# Patient Record
Sex: Female | Born: 1960 | Race: White | Hispanic: No | Marital: Married | Smoking: Never smoker
Health system: Southern US, Community
[De-identification: ages and names within clinical notes are randomized; demographics above are authoritative.]

## PROBLEM LIST (undated history)

## (undated) DIAGNOSIS — K219 Gastro-esophageal reflux disease without esophagitis: Secondary | ICD-10-CM

## (undated) DIAGNOSIS — G4730 Sleep apnea, unspecified: Secondary | ICD-10-CM

## (undated) DIAGNOSIS — E119 Type 2 diabetes mellitus without complications: Secondary | ICD-10-CM

## (undated) DIAGNOSIS — L409 Psoriasis, unspecified: Secondary | ICD-10-CM

## (undated) DIAGNOSIS — J309 Allergic rhinitis, unspecified: Secondary | ICD-10-CM

## (undated) HISTORY — PX: ORIF ANKLE FRACTURE: SUR919

## (undated) HISTORY — DX: Psoriasis, unspecified: L40.9

## (undated) HISTORY — DX: Allergic rhinitis, unspecified: J30.9

## (undated) HISTORY — DX: Gastro-esophageal reflux disease without esophagitis: K21.9

## (undated) HISTORY — DX: Type 2 diabetes mellitus without complications: E11.9

## (undated) HISTORY — DX: Sleep apnea, unspecified: G47.30

---
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
## 1983-01-05 HISTORY — PX: SEPTOPLASTY: SUR1290

## 2007-01-05 HISTORY — PX: COLONOSCOPY: SHX174

## 2007-11-13 ENCOUNTER — Ambulatory Visit: Payer: Self-pay | Admitting: Gastroenterology

## 2009-05-15 ENCOUNTER — Emergency Department: Payer: Self-pay | Admitting: Emergency Medicine

## 2009-07-24 ENCOUNTER — Emergency Department: Payer: Self-pay | Admitting: Internal Medicine
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
## 2013-06-10 ENCOUNTER — Ambulatory Visit: Payer: Self-pay | Admitting: Emergency Medicine

## 2013-06-10 LAB — RAPID STREP-A WITH REFLX: Micro Text Report: NEGATIVE

## 2013-06-13 LAB — BETA STREP CULTURE(ARMC)

## 2015-10-29 ENCOUNTER — Other Ambulatory Visit: Payer: Self-pay | Admitting: Family Medicine

## 2015-10-29 DIAGNOSIS — Z1231 Encounter for screening mammogram for malignant neoplasm of breast: Secondary | ICD-10-CM

## 2015-12-22 ENCOUNTER — Encounter: Payer: Self-pay | Admitting: Radiology

## 2015-12-22 ENCOUNTER — Ambulatory Visit
Admission: RE | Admit: 2015-12-22 | Discharge: 2015-12-22 | Disposition: A | Discharge status: Home / Self Care | Payer: 59 | Source: Ambulatory Visit | Attending: Family Medicine | Admitting: Family Medicine

## 2015-12-22 DIAGNOSIS — Z1231 Encounter for screening mammogram for malignant neoplasm of breast: Secondary | ICD-10-CM | POA: Insufficient documentation

## 2015-12-31 ENCOUNTER — Inpatient Hospital Stay
Admission: RE | Admit: 2015-12-31 | Discharge: 2015-12-31 | Disposition: A | Discharge status: Home / Self Care | Payer: Self-pay | Source: Ambulatory Visit | Attending: *Deleted | Admitting: *Deleted

## 2015-12-31 ENCOUNTER — Other Ambulatory Visit: Payer: Self-pay | Admitting: *Deleted

## 2015-12-31 DIAGNOSIS — Z9289 Personal history of other medical treatment: Secondary | ICD-10-CM

## 2017-01-19 ENCOUNTER — Other Ambulatory Visit: Payer: Self-pay | Admitting: Certified Nurse Midwife

## 2017-02-24 ENCOUNTER — Encounter: Payer: Self-pay | Admitting: Certified Nurse Midwife

## 2017-02-24 ENCOUNTER — Ambulatory Visit (INDEPENDENT_AMBULATORY_CARE_PROVIDER_SITE_OTHER): Payer: Managed Care, Other (non HMO) | Admitting: Certified Nurse Midwife

## 2017-02-24 VITALS — BP 140/90 | HR 58 | Ht 69.0 in | Wt 252.0 lb

## 2017-02-24 DIAGNOSIS — Z124 Encounter for screening for malignant neoplasm of cervix: Secondary | ICD-10-CM | POA: Diagnosis not present

## 2017-02-24 DIAGNOSIS — Z1231 Encounter for screening mammogram for malignant neoplasm of breast: Secondary | ICD-10-CM | POA: Diagnosis not present

## 2017-02-24 DIAGNOSIS — Z01419 Encounter for gynecological examination (general) (routine) without abnormal findings: Secondary | ICD-10-CM

## 2017-02-24 DIAGNOSIS — Z1239 Encounter for other screening for malignant neoplasm of breast: Secondary | ICD-10-CM

## 2017-02-24 DIAGNOSIS — Z1211 Encounter for screening for malignant neoplasm of colon: Secondary | ICD-10-CM

## 2017-02-24 NOTE — Progress Notes (Signed)
Gynecology Annual Exam  PCP: Dortha KernBliss, Laura K, MD  Chief Complaint:  Chief Complaint  Patient presents with  . Gynecologic Exam    History of Present Illness:Hannah Cobb presents today for her annual exam. She is a 57 year old Caucasian/White female, G0 P0000, whose LMP was 06/02/2012. Her menses are absent and she is postmenopausal. She has had no spotting.  She does not have hot flashes and night sweats.  The patient's past medical history is remarkable for diabetes and obesity.  Since her last annual GYN exam dated 12/31/2015, she has been able to lose 11# on the ketodiet. Her last hemoglobin A1C was 6.5%. Her mother was diagnosed with nonalcoholic cirrhosis. She is sexually active. She does not have vaginal dryness.  Her most recent pap smear was obtained 12/31/2015 and was NIL with negative HPV. The previous Pap was NIL with positive HRHPV. Her most recent mammogram obtained on 12/22/2015 was normal. There is no family history of breast cancer. There is no family history of ovarian cancer. The patient does do occ self breast exams.  She had a colonoscopy in 2009 that showed diverticulosis. Her next colonoscopy is due this year.  A DEXA scan is not applicable for this patient.  The patient does not smoke.  The patient does not drink alcohol.  The patient does not use illegal drugs.  The patient does not exercise. Her BMI is 37.21 kg/m2 The patient does get adequate calcium in her diet.  She has her labs managed by her PCP.    The patient denies current symptoms of depression.    Review of Systems: Review of Systems  Constitutional: Positive for weight loss (intentional). Negative for chills and fever.  HENT: Negative for congestion, sinus pain and sore throat.   Eyes: Negative for blurred vision and pain.  Respiratory: Negative for hemoptysis, shortness of breath and wheezing.   Cardiovascular: Negative for chest pain, palpitations and leg swelling.  Gastrointestinal:  Negative for abdominal pain, blood in stool, diarrhea, heartburn, nausea and vomiting.  Genitourinary: Negative for dysuria, frequency, hematuria and urgency.  Musculoskeletal: Negative for back pain, joint pain and myalgias.  Skin: Negative for itching and rash.  Neurological: Negative for dizziness, tingling and headaches.  Endo/Heme/Allergies: Positive for environmental allergies. Negative for polydipsia. Does not bruise/bleed easily.       Negative for hirsutism   Psychiatric/Behavioral: Negative for depression. The patient is not nervous/anxious and does not have insomnia.     Past Medical History:  Past Medical History:  Diagnosis Date  . Allergic rhinitis   . Diabetes mellitus without complication (HCC)   . GERD (gastroesophageal reflux disease)   . Psoriasis     Past Surgical History:  Past Surgical History:  Procedure Laterality Date  . COLONOSCOPY  2009   diverticulosis  . ORIF ANKLE FRACTURE Left Dec    jan 1991-removal of hardware  . SEPTOPLASTY  1985   deviated septum  . TONSILLECTOMY AND ADENOIDECTOMY  1978    Family History:  Family History  Problem Relation Age of Onset  . Cirrhosis Mother        nonalcoholic  . Diabetes Mother   . Heart disease Mother   . Hypertension Mother   . Osteoporosis Mother   . Neuropathy Mother   . Hypertension Father   . Lymphoma Brother 44  . Diabetes Maternal Grandfather   . Diabetes Paternal Grandmother     Social History:  Social History   Socioeconomic History  .  Marital status: Married    Spouse name: Luisa Hart since 2004  . Number of children: Not on file  . Years of education: Not on file  . Highest education level: Not on file  Social Needs  . Financial resource strain: Not on file  . Food insecurity - worry: Not on file  . Food insecurity - inability: Not on file  . Transportation needs - medical: Not on file  . Transportation needs - non-medical: Not on file  Occupational History  . Occupation: Lab  [employer]  . Smoking status: Never Smoker  . Smokeless tobacco: Never Used  Substance and Sexual Activity  . Alcohol use: No    Frequency: Never  . Drug use: No  . Sexual activity: Yes    Partners: Male    Birth control/protection: Post-menopausal  Other Topics Concern  . Not on file  Social History Narrative  . Not on file    Allergies:  No Known Allergies  Medications: Current Outpatient Medications:  .  clorazepate (TRANXENE) 7.5 MG tablet, continuously as needed., Disp: , Rfl:  Physical Exam Vitals: BP 140/90   Pulse (!) 58   Ht 5\' 9"  (1.753 m)   Wt 252 lb (114.3 kg)   BMI 37.21 kg/m  General: WF in NAD HEENT: normocephalic, anicteric Neck: no thyroid enlargement, no palpable nodules, no cervical lymphadenopathy  Pulmonary: No increased work of breathing, CTAB Cardiovascular: RRR, without murmur  Breast: Breast symmetrical, no tenderness, no palpable nodules or masses, no skin or nipple retraction present, no nipple discharge.  No axillary, infraclavicular or supraclavicular lymphadenopathy. Abdomen: Soft, non-tender, non-distended.  Umbilicus without lesions.  No hepatomegaly or masses palpable. No evidence of hernia. Genitourinary:  External: Normal external female genitalia.  Normal urethral meatus, normal  Bartholin's and Skene's glands.    Vagina: Normal vaginal mucosa, no evidence of prolapse.    Cervix: Grossly normal in appearance, no bleeding, non-tender, nullip  Uterus: Anteverted, normal size, shape, and consistency, mobile, and non-tender  Adnexa: No adnexal masses, non-tender  Rectal: deferred  Lymphatic: no evidence of inguinal lymphadenopathy Extremities: no edema, erythema, or tenderness Neurologic: Grossly intact Psychiatric: mood appropriate, affect full     Assessment: 57 y.o. annual gyn exam  Plan:   1) Breast cancer screening - recommend monthly self breast exam and annual mammogram Mammogram was ordered today.  2) Cervical  cancer screening - Pap was done. ASCCP guidelines and rational discussed.  Patient opts for yearly screening interval  3) Colon cancer screening--colonoscopy due this year. Will refer to [facility] (Dr Ricki Rodriguez did last colonoscopy)  4) Discussed calcium and vitamin D3 requirements  5) RTO 1 yearr and prn  Farrel Conners, CNM

## 2017-02-25 ENCOUNTER — Encounter: Payer: Self-pay | Admitting: Certified Nurse Midwife

## 2017-02-25 DIAGNOSIS — E119 Type 2 diabetes mellitus without complications: Secondary | ICD-10-CM | POA: Insufficient documentation

## 2017-02-25 DIAGNOSIS — L409 Psoriasis, unspecified: Secondary | ICD-10-CM | POA: Insufficient documentation

## 2017-02-25 DIAGNOSIS — K219 Gastro-esophageal reflux disease without esophagitis: Secondary | ICD-10-CM | POA: Insufficient documentation

## 2017-02-25 DIAGNOSIS — J309 Allergic rhinitis, unspecified: Secondary | ICD-10-CM | POA: Insufficient documentation

## 2017-03-01 LAB — IGP,RFX APTIMA HPV ALL PTH: PAP Smear Comment: 0

## 2017-04-08 ENCOUNTER — Ambulatory Visit
Admission: RE | Admit: 2017-04-08 | Discharge: 2017-04-08 | Disposition: A | Discharge status: Home / Self Care | Payer: Managed Care, Other (non HMO) | Source: Ambulatory Visit | Attending: Certified Nurse Midwife | Admitting: Certified Nurse Midwife

## 2017-04-08 DIAGNOSIS — Z1231 Encounter for screening mammogram for malignant neoplasm of breast: Secondary | ICD-10-CM | POA: Insufficient documentation

## 2017-04-08 DIAGNOSIS — Z1239 Encounter for other screening for malignant neoplasm of breast: Secondary | ICD-10-CM

## 2017-07-04 LAB — HM COLONOSCOPY

## 2017-12-22 ENCOUNTER — Emergency Department
Admission: EM | Admit: 2017-12-22 | Discharge: 2017-12-22 | Disposition: A | Discharge status: Home / Self Care | Payer: Managed Care, Other (non HMO) | Attending: Emergency Medicine | Admitting: Emergency Medicine

## 2017-12-22 ENCOUNTER — Other Ambulatory Visit: Payer: Self-pay

## 2017-12-22 ENCOUNTER — Emergency Department: Payer: Managed Care, Other (non HMO)

## 2017-12-22 ENCOUNTER — Encounter: Payer: Self-pay | Admitting: Emergency Medicine

## 2017-12-22 DIAGNOSIS — E119 Type 2 diabetes mellitus without complications: Secondary | ICD-10-CM | POA: Diagnosis not present

## 2017-12-22 DIAGNOSIS — R0789 Other chest pain: Secondary | ICD-10-CM | POA: Diagnosis not present

## 2017-12-22 DIAGNOSIS — I10 Essential (primary) hypertension: Secondary | ICD-10-CM | POA: Diagnosis not present

## 2017-12-22 LAB — BASIC METABOLIC PANEL
Anion gap: 9 (ref 5–15)
BUN: 14 mg/dL (ref 6–20)
CO2: 27 mmol/L (ref 22–32)
Calcium: 9.2 mg/dL (ref 8.9–10.3)
Chloride: 102 mmol/L (ref 98–111)
Creatinine, Ser: 0.66 mg/dL (ref 0.44–1.00)
GFR calc Af Amer: >60 mL/min (ref >60)
GFR calc non Af Amer: >60 mL/min (ref >60)
Glucose, Bld: 148 mg/dL — ABNORMAL HIGH (ref 70–99)
Potassium: 4.2 mmol/L (ref 3.5–5.1)
Sodium: 138 mmol/L (ref 135–145)

## 2017-12-22 LAB — CBC WITH DIFFERENTIAL/PLATELET
ABS IMMATURE GRANULOCYTES: 0.02 10*3/uL (ref 0.00–0.07)
BASOS PCT: 1 %
Basophils Absolute: 0.1 10*3/uL (ref 0.0–0.1)
Eosinophils Absolute: 0.2 10*3/uL (ref 0.0–0.5)
Eosinophils Relative: 3 %
HCT: 40.4 % (ref 36.0–46.0)
Hemoglobin: 13.1 g/dL (ref 12.0–15.0)
IMMATURE GRANULOCYTES: 0 %
Lymphocytes Relative: 29 %
Lymphs Abs: 1.8 10*3/uL (ref 0.7–4.0)
MCH: 28.9 pg (ref 26.0–34.0)
MCHC: 32.4 g/dL (ref 30.0–36.0)
MCV: 89.2 fL (ref 80.0–100.0)
Monocytes Absolute: 0.3 10*3/uL (ref 0.1–1.0)
Monocytes Relative: 5 %
NEUTROS ABS: 3.8 10*3/uL (ref 1.7–7.7)
NEUTROS PCT: 62 %
NRBC: 0 % (ref 0.0–0.2)
PLATELETS: 248 10*3/uL (ref 150–400)
RBC: 4.53 MIL/uL (ref 3.87–5.11)
RDW: 12.5 % (ref 11.5–15.5)
WBC: 6.1 10*3/uL (ref 4.0–10.5)

## 2017-12-22 LAB — TROPONIN I: Troponin I: <0.03 ng/mL (ref <0.03)

## 2017-12-22 NOTE — ED Provider Notes (Addendum)
[facility] Emergency Department Provider Note  ____________________________________________   First MD Initiated Contact with Patient 12/22/17 0930     (approximate)  I have reviewed the triage vital signs and the nursing notes.   HISTORY  Chief Complaint Hypertension   HPI Hannah Cobb is a 57 y.o. female with a history of psoriasis, diabetes and GERD who was presented emergency department today with elevated blood pressure.  She states that she has been very stressed recently.  Says that she also began having left-sided thoracic pain today at work which is sharp and hurts worse with movement.  Denies any shortness of breath, central chest pain.  Denies any nausea or vomiting.  Says that she is a strong history of cardiac disease in her family.  Does not take any blood pressure medications at home but at work found her blood pressure to be 170s over 90s.  Has an anxiety medication that she has not taken today but says that she is feeling very anxious.   Past Medical History:  Diagnosis Date  . Allergic rhinitis   . Diabetes mellitus without complication (HCC)   . GERD (gastroesophageal reflux disease)   . Psoriasis     Patient Active Problem List   Diagnosis Date Noted  . Psoriasis   . GERD (gastroesophageal reflux disease)   . Diabetes mellitus without complication (HCC)   . Allergic rhinitis     Past Surgical History:  Procedure Laterality Date  . COLONOSCOPY  2009   diverticulosis  . ORIF ANKLE FRACTURE Left Dec    jan 1991-removal of hardware  . SEPTOPLASTY  1985   deviated septum  . TONSILLECTOMY AND ADENOIDECTOMY  1978    Prior to Admission medications   Medication Sig Start Date End Date Taking? Authorizing Provider  clorazepate (TRANXENE) 7.5 MG tablet continuously as needed. 11/23/13   [provider]    Allergies Patient has no known allergies.  Family History  Problem Relation Age of Onset  . Cirrhosis Mother         nonalcoholic  . Diabetes Mother   . Heart disease Mother   . Hypertension Mother   . Osteoporosis Mother   . Neuropathy Mother   . Hypertension Father   . Lymphoma Brother 44  . Diabetes Maternal Grandfather   . Diabetes Paternal Grandmother     Social History Social History   Tobacco Use  . Smoking status: Never Smoker  . Smokeless tobacco: Never Used  Substance Use Topics  . Alcohol use: No    Frequency: Never  . Drug use: No    Review of Systems  Constitutional: No fever/chills Eyes: No visual changes. ENT: No sore throat. Cardiovascular: As above Respiratory: Denies shortness of breath. Gastrointestinal: No abdominal pain.  No nausea, no vomiting.  No diarrhea.  No constipation. Genitourinary: Negative for dysuria. Musculoskeletal: Negative for back pain. Skin: Negative for rash. Neurological: Negative for headaches, focal weakness or numbness.   ____________________________________________   PHYSICAL EXAM:  VITAL SIGNS: ED Triage Vitals  Enc Vitals Group     BP 12/22/17 0934 (!) 170/98     Pulse Rate 12/22/17 0934 (!) 57     Resp 12/22/17 0934 18     Temp 12/22/17 0934 97.7 F (36.5 C)     Temp Source 12/22/17 0934 Oral     SpO2 12/22/17 0934 99 %     Weight 12/22/17 0925 246 lb (111.6 kg)     Height 12/22/17 0925 5\' 8"  (  1.727 m)     Head Circumference --      Peak Flow --      Pain Score 12/22/17 0925 3     Pain Loc --      Pain Edu? --      Excl. in GC? --     Constitutional: Alert and oriented. Well appearing and in no acute distress. Eyes: Conjunctivae are normal.  Head: Atraumatic. Nose: No congestion/rhinnorhea. Mouth/Throat: Mucous membranes are moist.  Neck: No stridor.   Cardiovascular: Normal rate, regular rhythm. Grossly normal heart sounds.  Good peripheral circulation with equal and bilateral radial pulses.  Chest pain reproducible over the left lateral thoracic wall but without any crepitus, deformity or ecchymosis  noted. Respiratory: Normal respiratory effort.  No retractions. Lungs CTAB. Gastrointestinal: Soft and nontender. No distention.  Musculoskeletal: No lower extremity tenderness nor edema.  No joint effusions. Neurologic:  Normal speech and language. No gross focal neurologic deficits are appreciated. Skin:  Skin is warm, dry and intact. No rash noted. Psychiatric: Mood and affect are normal. Speech and behavior are normal.  ____________________________________________   LABS (all labs ordered are listed, but only abnormal results are displayed)  Labs Reviewed  BASIC METABOLIC PANEL - Abnormal; Notable for the following components:      Result Value   Glucose, Bld 148 (*)    All other components within normal limits  CBC WITH DIFFERENTIAL/PLATELET  TROPONIN I   ____________________________________________  EKG  ED ECG REPORT I, Arelia Longestavid M Nelda Luckey, the attending physician, personally viewed and interpreted this ECG.   Date: 12/22/2017  EKG Time: 0950  Rate: 57  Rhythm: sinus bradycardia  Axis: Normal  Intervals: Nonspecific intraventricular conduction delay.  ST&T Change: No ST segment elevation or depression.  No abnormal T wave inversion.  ____________________________________________  RADIOLOGY  Chest x-ray without acute process. ____________________________________________   PROCEDURES  Procedure(s) performed:   Procedures  Critical Care performed:   ____________________________________________   INITIAL IMPRESSION / ASSESSMENT AND PLAN / ED COURSE  Pertinent labs & imaging results that were available during my care of the patient were reviewed by me and considered in my medical decision making (see chart for details).  Differential diagnosis includes, but is not limited to, ACS, aortic dissection, pulmonary embolism, cardiac tamponade, pneumothorax, pneumonia, pericarditis, myocarditis, GI-related causes including esophagitis/gastritis, and musculoskeletal  chest wall pain.   As part of my medical decision making, I reviewed the following data within the electronic MEDICAL RECORD NUMBER Notes from prior ED visits  ----------------------------------------- 10:53 AM on 12/22/2017 -----------------------------------------  Patient took a dose of her clorazepate.  Blood pressure now 147/71.  Reassuring lab work as well as EKG.  Patient will follow-up with her primary care doctor for repeat blood pressure visit.  I will not be starting patient on medication at this time as her symptoms appear to be improving with her anxiety medication and she reported anxiety upon the initial evaluation.  Furthermore, the patient denies any shortness of breath or central chest pain or any radiating chest pain to her back or her arms.  Likely musculoskeletal pain.  Patient understanding of the diagnosis well treatment and willing to comply.  ____________________________________________   FINAL CLINICAL IMPRESSION(S) / ED DIAGNOSES  Hypertension.  Chest wall pain.   NEW MEDICATIONS STARTED DURING THIS VISIT:  New Prescriptions   No medications on file     Note:  This document was prepared using Dragon voice recognition software and may include unintentional dictation errors.  Alan Riles, Myra Rude, MD 12/22/17 1054  I also discussed the "yeast infection" with the patient.  She says that she is not having any rash, discharge or burning with urination.  Was concerned because she has psoriasis about yeast in her that it can be diagnosed with a urine test.  We also clarified that she was talking about "Candida" and not "chlamydia."    Sevin Langenbach, Myra Rude, MD 12/22/17 1058

## 2017-12-22 NOTE — ED Triage Notes (Signed)
Pt arrived via POV with reports of left side pain when moving her arm and elevated blood pressure. Pt states while at work today her BP was 170/98, pt is not currently on BP meds. Pt was seen by EMS at job had normal EKG.   Pt also requesting urine test for chlamydia yeast due to eczema.

## 2017-12-22 NOTE — ED Notes (Signed)
NAD noted at time of D/C. Pt denies questions or concerns. Pt ambulatory to the lobby at this time.  

## 2017-12-22 NOTE — ED Notes (Signed)
Patient presents to the ED with pain to her left side.  Patient states area is somewhat tender on palpation.  Patient states area began hurting while at work and co-workers checked her blood pressure and it was high so they called EMS.  EMS recommended to patient that she may need blood work to rule out a cardiac event.  Patient is in no obvious distress at this time.  Denies chest pain.  Patient reports family history of heart problems.

## 2018-05-18 IMAGING — MG MM DIGITAL SCREENING BILAT W/ CAD
4 series · 4 of 4 positions shown · non-contrast
Comparison: Previous exam(s).

CLINICAL DATA: Screening.

EXAM:
DIGITAL SCREENING BILATERAL MAMMOGRAM WITH CAD

[R CC]
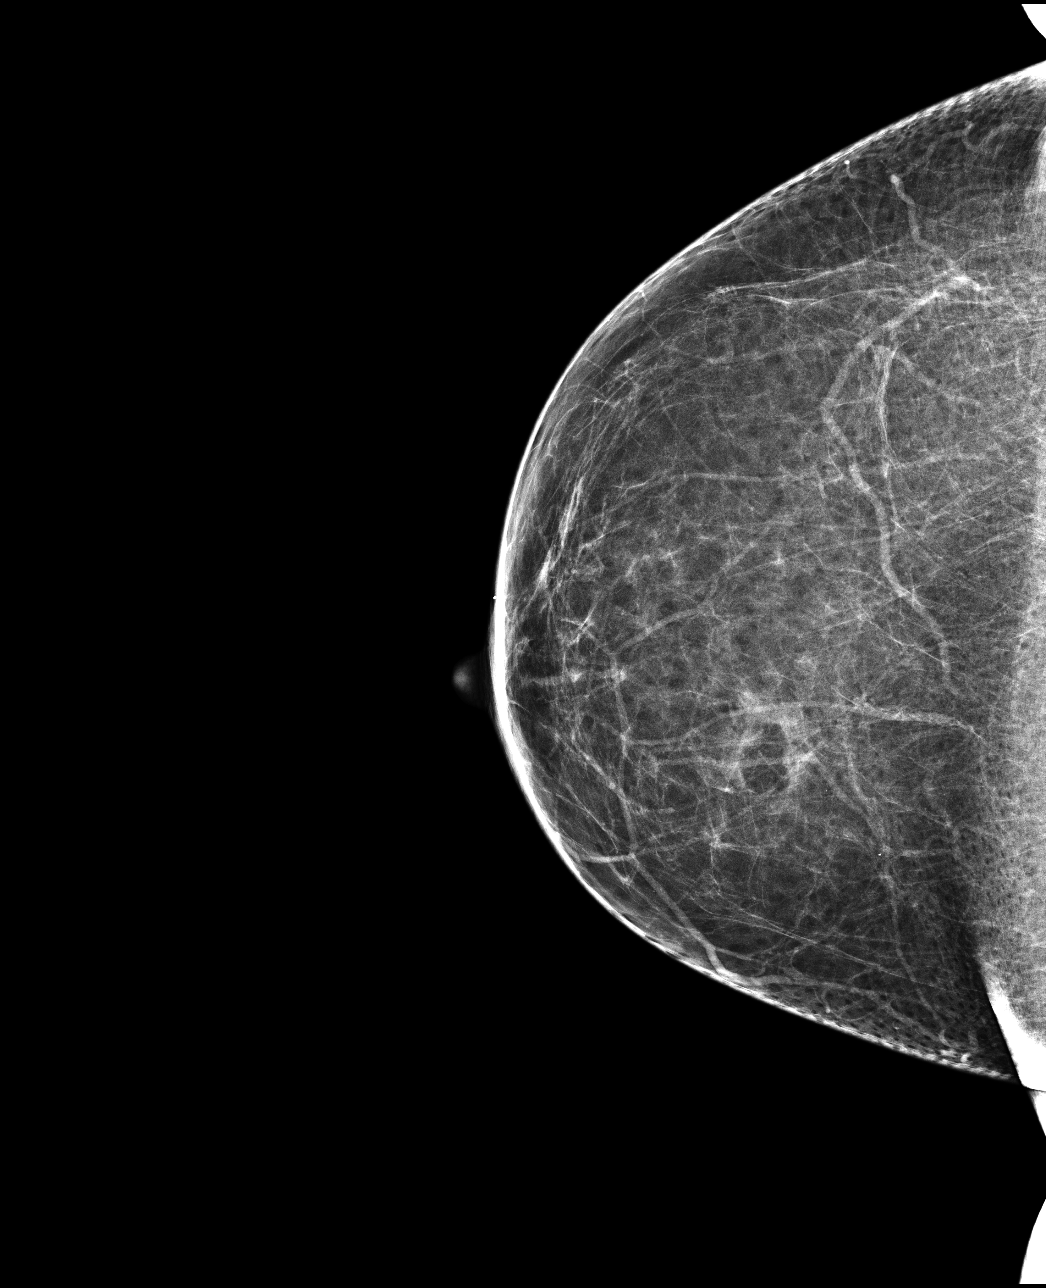

[L MLO]
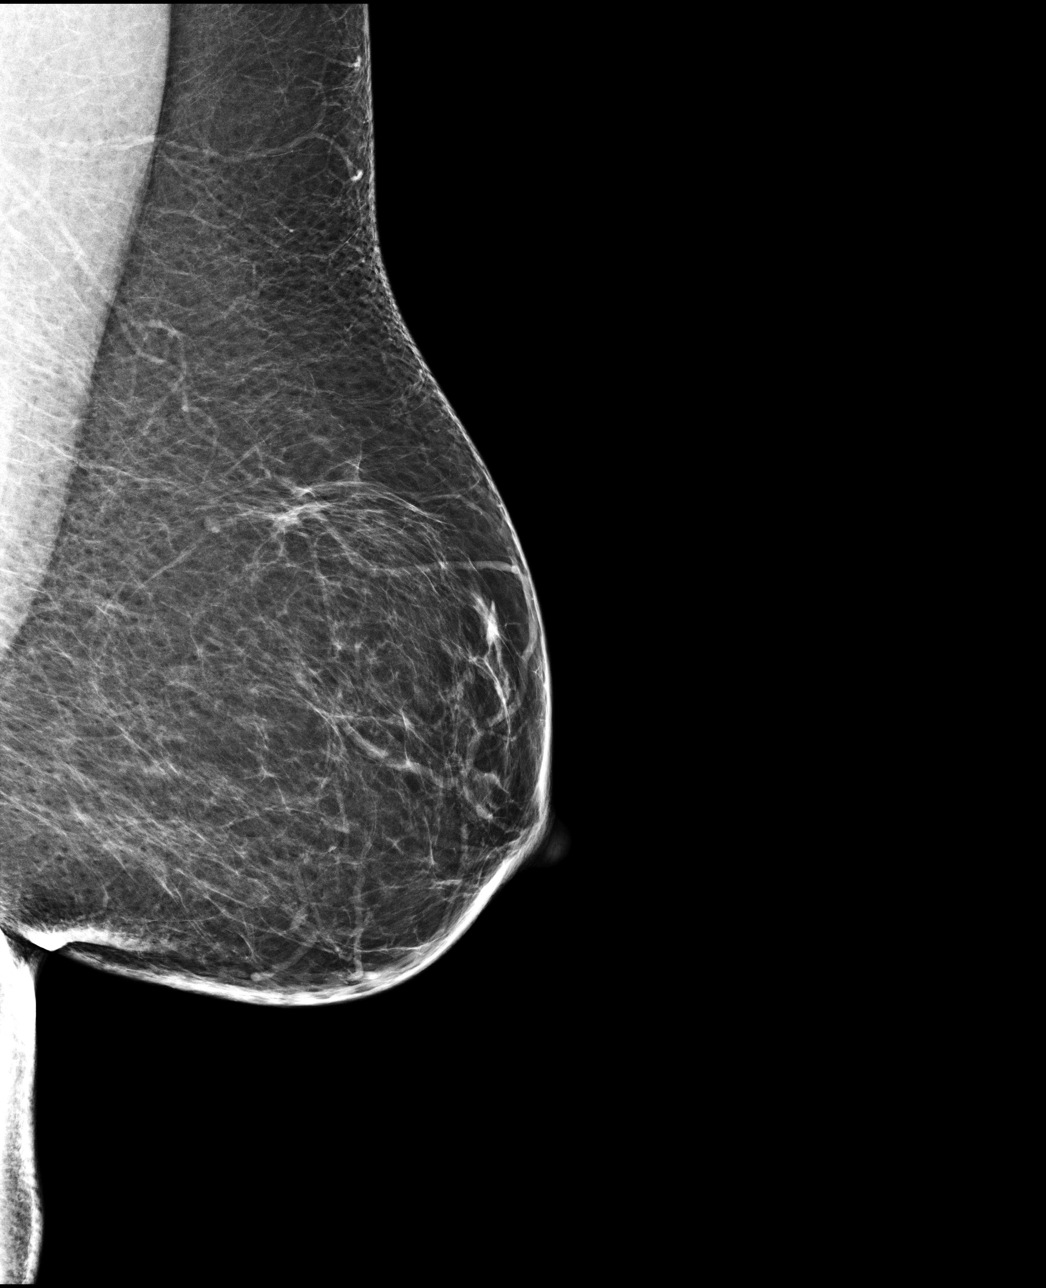

[L CC]
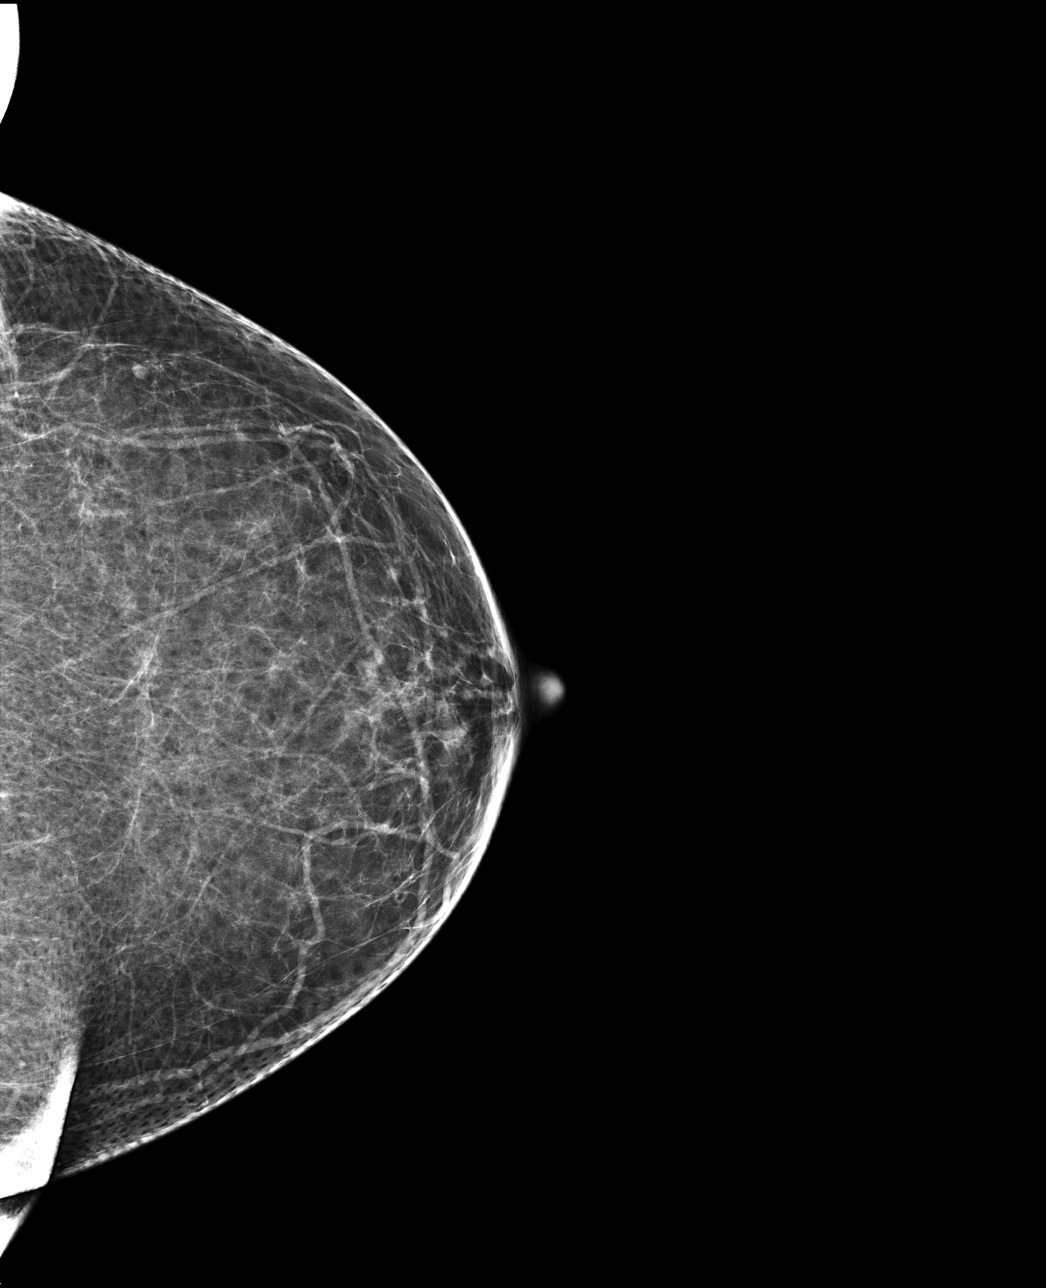

[R MLO]
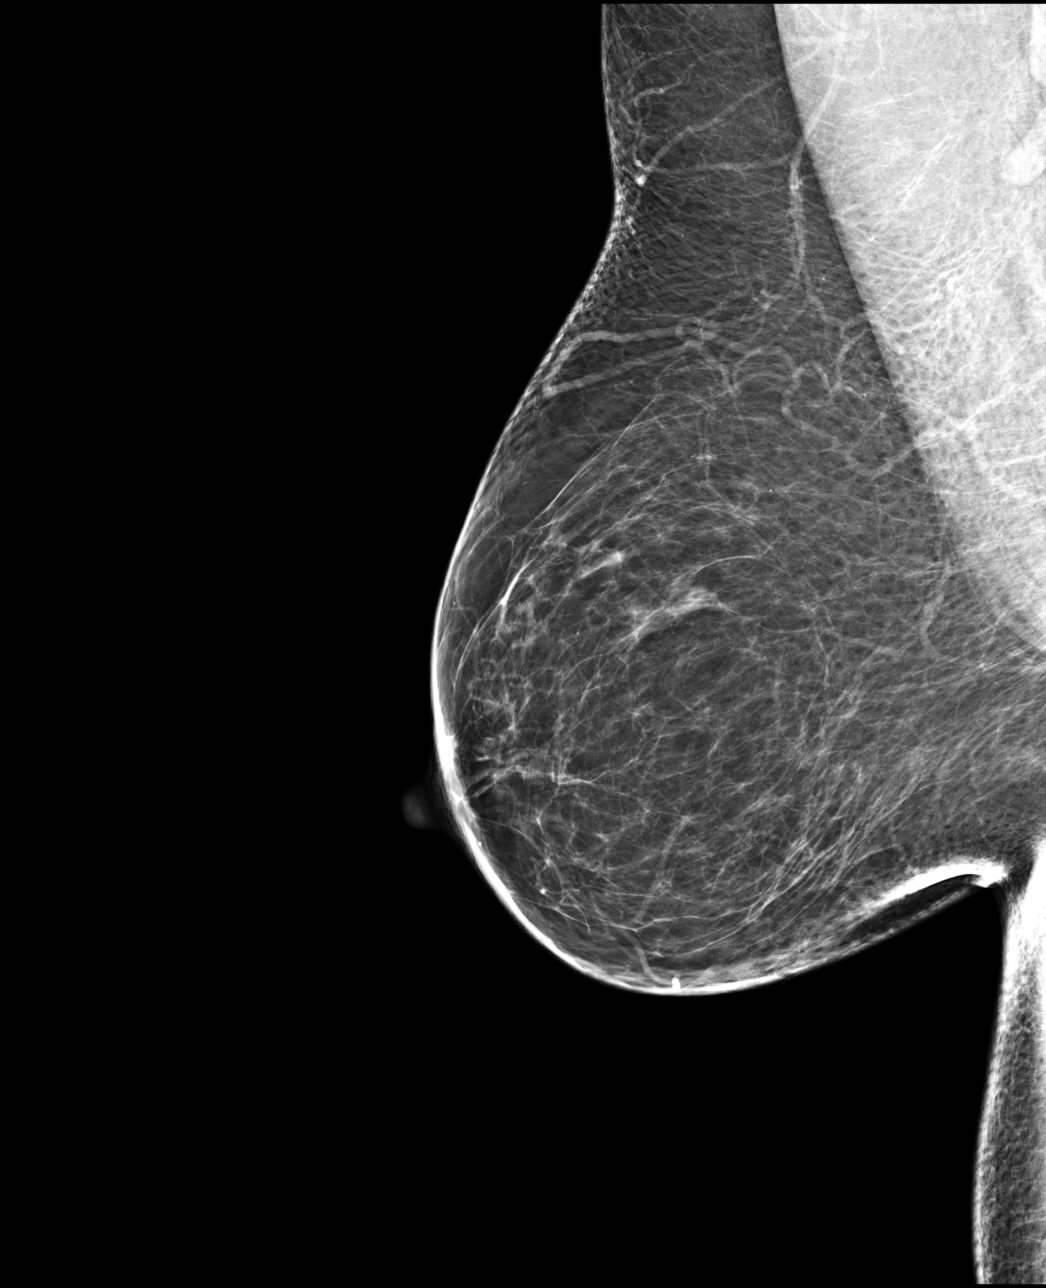

[4 of 4 positions shown; findings below may reference images not displayed]

ACR Breast Density Category b: There are scattered areas of
fibroglandular density.
FINDINGS: There are no findings suspicious for malignancy. Images were
processed with CAD.
IMPRESSION: No mammographic evidence of malignancy. A result letter of this
screening mammogram will be mailed directly to the patient.

RECOMMENDATION:
Screening mammogram in one year. (Code:AS-G-LCT)

BI-RADS CATEGORY  1: Negative.

## 2018-06-07 ENCOUNTER — Other Ambulatory Visit: Payer: Self-pay | Admitting: Certified Nurse Midwife

## 2018-06-07 DIAGNOSIS — Z1231 Encounter for screening mammogram for malignant neoplasm of breast: Secondary | ICD-10-CM

## 2018-06-16 ENCOUNTER — Encounter (INDEPENDENT_AMBULATORY_CARE_PROVIDER_SITE_OTHER): Payer: Self-pay

## 2018-06-16 ENCOUNTER — Other Ambulatory Visit: Payer: Self-pay

## 2018-06-16 ENCOUNTER — Ambulatory Visit
Admission: RE | Admit: 2018-06-16 | Discharge: 2018-06-16 | Disposition: A | Discharge status: Home / Self Care | Payer: Managed Care, Other (non HMO) | Source: Ambulatory Visit | Attending: Certified Nurse Midwife | Admitting: Certified Nurse Midwife

## 2018-06-16 DIAGNOSIS — Z1231 Encounter for screening mammogram for malignant neoplasm of breast: Secondary | ICD-10-CM | POA: Insufficient documentation

## 2019-06-22 ENCOUNTER — Ambulatory Visit (INDEPENDENT_AMBULATORY_CARE_PROVIDER_SITE_OTHER): Payer: Managed Care, Other (non HMO) | Admitting: Certified Nurse Midwife

## 2019-06-22 ENCOUNTER — Encounter: Payer: Self-pay | Admitting: Certified Nurse Midwife

## 2019-06-22 ENCOUNTER — Other Ambulatory Visit: Payer: Self-pay

## 2019-06-22 VITALS — BP 139/72 | HR 65 | Resp 18 | Ht 68.0 in | Wt 247.6 lb

## 2019-06-22 DIAGNOSIS — Z1231 Encounter for screening mammogram for malignant neoplasm of breast: Secondary | ICD-10-CM | POA: Diagnosis not present

## 2019-06-22 DIAGNOSIS — Z124 Encounter for screening for malignant neoplasm of cervix: Secondary | ICD-10-CM

## 2019-06-22 DIAGNOSIS — E669 Obesity, unspecified: Secondary | ICD-10-CM | POA: Insufficient documentation

## 2019-06-22 NOTE — Progress Notes (Signed)
Gynecology Annual Exam  PCP: Dortha Kern, MD  Chief Complaint:  Chief Complaint  Patient presents with  . Gynecologic Exam    History of Present Illness:Hannah Cobb presents today for her annual exam. She is a 59 year old Caucasian/White female, G0 P0000, whose LMP was 06/02/2012. Her menses are absent and she is postmenopausal. She has had no spotting.  She does not have hot flashes and night sweats.  The patient's past medical history is remarkable for diabetes and obesity.  Since her last annual GYN exam dated 02/24/2017, she gained weight and had gotten to 267#. She started the [place] diet plan and has lost 24# in 5 weeks. She is sexually active. She does not have vaginal dryness.  Her most recent pap smear was obtained 02/24/2017 and was NIL. Hx of Pap with positive HRHPV prior to 2017 Her most recent mammogram obtained on 06/16/2018 was normal. There is no family history of breast cancer. There is no family history of ovarian cancer. The patient does do self breast exams.  She had a colonoscopy in 11/18/2017 that was normal.  Her next colonoscopy is due in 2019 The patient has not had a DEXA scan  The patient does not smoke.  The patient does not drink alcohol.  The patient does not use illegal drugs.  The patient does not exercise. Her BMI is 37.66 kg/m2 The patient may get adequate calcium in her diet.  She has her labs managed by her PCP.    The patient denies current symptoms of depression.    Review of Systems: Review of Systems  Constitutional: Positive for weight loss (intentional). Negative for chills and fever.  HENT: Negative for congestion, sinus pain and sore throat.   Eyes: Negative for blurred vision and pain.  Respiratory: Negative for hemoptysis, shortness of breath and wheezing.   Cardiovascular: Negative for chest pain, palpitations and leg swelling.  Gastrointestinal: Positive for constipation (takes Colace prn). Negative for blood in stool,  diarrhea, heartburn, nausea and vomiting.  Genitourinary: Negative for dysuria, frequency, hematuria and urgency.  Musculoskeletal: Positive for joint pain (resolving right knee contusion). Negative for back pain and myalgias.  Skin: Positive for itching and rash (psoriasis).  Neurological: Negative for dizziness, tingling and headaches.  Endo/Heme/Allergies: Positive for environmental allergies. Negative for polydipsia. Does not bruise/bleed easily.       Negative for hirsutism   Psychiatric/Behavioral: Negative for depression. The patient is not nervous/anxious and does not have insomnia.     Past Medical History:  Past Medical History:  Diagnosis Date  . Allergic rhinitis   . Diabetes mellitus without complication (HCC)   . GERD (gastroesophageal reflux disease)   . Psoriasis     Past Surgical History:  Past Surgical History:  Procedure Laterality Date  . COLONOSCOPY  2009   diverticulosis  . ORIF ANKLE FRACTURE Left Dec    jan 1991-removal of hardware  . SEPTOPLASTY  1985   deviated septum  . TONSILLECTOMY AND ADENOIDECTOMY  1978    Family History:  Family History  Problem Relation Age of Onset  . Cirrhosis Mother        nonalcoholic  . Diabetes Mother   . Heart disease Mother   . Hypertension Mother   . Osteoporosis Mother   . Neuropathy Mother   . Hypertension Father   . Lymphoma Brother 44  . Diabetes Maternal Grandfather   . Hypertension Maternal Grandfather   . Heart disease Maternal Grandfather   .  Diabetes Paternal Grandmother   . Hypertension Maternal Grandmother   . Heart disease Maternal Grandmother     Social History:  Social History   Socioeconomic History  . Marital status: Married    Spouse name: Saralyn Pilar since 2004  . Number of children: Not on file  . Years of education: Not on file  . Highest education level: Not on file  Occupational History  . Occupation: Lab [employer]  . Smoking status: Never Smoker  . Smokeless tobacco:  Never Used  Vaping Use  . Vaping Use: Never used  Substance and Sexual Activity  . Alcohol use: No  . Drug use: No  . Sexual activity: Yes    Partners: Male    Birth control/protection: Post-menopausal  Other Topics Concern  . Not on file  Social History Narrative  . Not on file   Social Determinants of Health   Financial Resource Strain:   . Difficulty of Paying Living Expenses:   Food Insecurity:   . Worried About Charity fundraiser in the Last Year:   . Arboriculturist in the Last Year:   Transportation Needs:   . Film/video editor (Medical):   Marland Kitchen Lack of Transportation (Non-Medical):   Physical Activity:   . Days of Exercise per Week:   . Minutes of Exercise per Session:   Stress:   . Feeling of Stress :   Social Connections:   . Frequency of Communication with Friends and Family:   . Frequency of Social Gatherings with Friends and Family:   . Attends Religious Services:   . Active Member of Clubs or Organizations:   . Attends Archivist Meetings:   Marland Kitchen Marital Status:   Intimate Partner Violence:   . Fear of Current or Ex-Partner:   . Emotionally Abused:   Marland Kitchen Physically Abused:   . Sexually Abused:     Allergies:  No Known Allergies  Medications: Current Outpatient Medications:  .  clorazepate (TRANXENE) 7.5 MG tablet, continuously as needed., Disp: , Rfl:  .  naproxen (NAPROSYN) 500 MG tablet, Take 500 mg by mouth 2 (two) times daily., Disp: , Rfl:   Colace  And Tums prn Physical Exam Vitals: BP 139/72   Pulse 65   Resp 18   Ht 5\' 8"  (1.727 m)   Wt 247 lb 9.6 oz (112.3 kg)   SpO2 98%   BMI 37.65 kg/m  General: WF in NAD HEENT: normocephalic, anicteric Neck: no thyroid enlargement, no palpable nodules, no cervical lymphadenopathy  Pulmonary: No increased work of breathing, CTAB Cardiovascular: RRR, without murmur  Breast: Breast symmetrical, no tenderness, no palpable nodules or masses, no skin or nipple retraction present, no nipple  discharge.  No axillary, infraclavicular or supraclavicular lymphadenopathy. Abdomen: Soft, non-tender, non-distended.  Umbilicus without lesions.  No hepatomegaly or masses palpable. No evidence of hernia. Genitourinary:  External: Normal external female genitalia.  Normal urethral meatus, normal Bartholin's and Skene's glands.    Vagina: Normal vaginal mucosa, no evidence of prolapse.    Cervix: Grossly normal in appearance, no bleeding, non-tender, nullip  Uterus: Anteverted, normal size, shape, and consistency, mobile, and non-tender  Adnexa: No adnexal masses, non-tender  Rectal: deferred  Lymphatic: no evidence of inguinal lymphadenopathy Extremities: no edema, erythema, or tenderness Neurologic: Grossly intact Psychiatric: mood appropriate, affect full     Assessment: 59 y.o. annual gyn exam  Plan:   1) Breast cancer screening - recommend monthly self breast exam and annual mammogram  Mammogram was ordered today.  2) Cervical cancer screening - Pap was done. ASCCP guidelines and rational discussed.  Patient opts for yearly screening interval  3) Colon cancer screening--colonoscopy up to date. Next due 2029  4) Discussed calcium and vitamin D3 requirements. Encouraged to take vitamin D3 1000 IU daily  5) RTO 1 yearr and prn  Farrel Conners, CNM

## 2019-06-25 LAB — IGP, APTIMA HPV: HPV Aptima: NEGATIVE

## 2019-06-26 ENCOUNTER — Encounter: Payer: Self-pay | Admitting: Certified Nurse Midwife

## 2019-07-06 ENCOUNTER — Ambulatory Visit
Admission: RE | Admit: 2019-07-06 | Discharge: 2019-07-06 | Disposition: A | Discharge status: Home / Self Care | Payer: Managed Care, Other (non HMO) | Source: Ambulatory Visit | Attending: Certified Nurse Midwife | Admitting: Certified Nurse Midwife

## 2019-07-06 DIAGNOSIS — Z1231 Encounter for screening mammogram for malignant neoplasm of breast: Secondary | ICD-10-CM | POA: Insufficient documentation

## 2020-07-03 ENCOUNTER — Other Ambulatory Visit: Payer: Self-pay

## 2020-07-03 ENCOUNTER — Other Ambulatory Visit (HOSPITAL_COMMUNITY)
Admission: RE | Admit: 2020-07-03 | Discharge: 2020-07-03 | Disposition: A | Discharge status: Home / Self Care | Payer: Managed Care, Other (non HMO) | Source: Ambulatory Visit | Attending: Obstetrics | Admitting: Obstetrics

## 2020-07-03 ENCOUNTER — Encounter: Payer: Self-pay | Admitting: Obstetrics

## 2020-07-03 ENCOUNTER — Ambulatory Visit (INDEPENDENT_AMBULATORY_CARE_PROVIDER_SITE_OTHER): Payer: Managed Care, Other (non HMO) | Admitting: Obstetrics

## 2020-07-03 VITALS — BP 125/72 | Ht 68.0 in | Wt 233.6 lb

## 2020-07-03 DIAGNOSIS — Z1231 Encounter for screening mammogram for malignant neoplasm of breast: Secondary | ICD-10-CM

## 2020-07-03 DIAGNOSIS — Z124 Encounter for screening for malignant neoplasm of cervix: Secondary | ICD-10-CM | POA: Insufficient documentation

## 2020-07-03 DIAGNOSIS — Z01419 Encounter for gynecological examination (general) (routine) without abnormal findings: Secondary | ICD-10-CM

## 2020-07-03 NOTE — Progress Notes (Signed)
Gynecology Annual Exam  PCP: Hannah Kern, MD  Chief Complaint:  Chief Complaint  Patient presents with   Gynecologic Exam    History of Present Illness:Patient is a 60 y.o. G0P0000 presents for annual exam. The patient has no complaints today. She has a PCP and waqs seen by them recently for her annual. She has not had a GYN PE or pap in three years. Prefers annual pap smears. 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 Leaving for [place] in sevreal weeks. She is married; her husband is on disability, and she has no children.  LMP: No LMP recorded. Patient is postmenopausal. The patient is sexually active. She denies dyspareunia.  The patient does not perform self breast exams.  There is no notable family history of breast or ovarian cancer in her family.  The patient wears seatbelts: yes.   The patient has regular exercise: no.    The patient denies current symptoms of depression.     Review of Systems: Review of Systems  Constitutional: Negative.   Eyes: Negative.   Respiratory: Negative.    Cardiovascular: Negative.   Gastrointestinal: Negative.   Genitourinary: Negative.   Skin:        Psoriasis- many scars on her legs from scratching. Numerous varicosities on her upper legs, and anksle and feet - spider veins .  Neurological: Negative.   Endo/Heme/Allergies: Negative.   Psychiatric/Behavioral: Negative.     Past Medical History:  Patient Active Problem List   Diagnosis Date Noted   Obesity 06/22/2019   Psoriasis    GERD (gastroesophageal reflux disease)    Diabetes mellitus without complication (HCC)    Allergic rhinitis     Past Surgical History:  Past Surgical History:  Procedure Laterality Date   COLONOSCOPY  2009   diverticulosis   ORIF ANKLE FRACTURE Left Dec    jan 1991-removal of hardware   SEPTOPLASTY  1985   deviated septum   TONSILLECTOMY AND ADENOIDECTOMY  1978    Gynecologic History:  No LMP recorded.  Patient is postmenopausal. Last Pap: Results were: 2019 no abnormalities  Last mammogram: 2021 Results were: BI-RAD I  Obstetric History: G0P0000  Family History:  Family History  Problem Relation Age of Onset   Cirrhosis Mother        nonalcoholic   Diabetes Mother    Heart disease Mother    Hypertension Mother    Osteoporosis Mother    Neuropathy Mother    Hypertension Father    Lymphoma Brother 61   Diabetes Maternal Grandfather    Hypertension Maternal Grandfather    Heart disease Maternal Grandfather    Diabetes Paternal Grandmother    Hypertension Maternal Grandmother    Heart disease Maternal Grandmother     Social History:  Social History   Socioeconomic History   Marital status: Married    Spouse name: Hannah Cobb since 2004   Number of children: Not on file   Years of education: Not on file   Highest education level: Not on file  Occupational History   Occupation: Lab Tech  Tobacco Use   Smoking status: Never   Smokeless tobacco: Never  Vaping Use   Vaping Use: Never used  Substance and Sexual Activity   Alcohol use: No   Drug use: No   Sexual activity: Yes    Partners: Male    Birth control/protection: Post-menopausal  Other Topics Concern   Not on file  Social  History Narrative   Not on file   Social Determinants of Health   Financial Resource Strain: Not on file  Food Insecurity: Not on file  Transportation Needs: Not on file  Physical Activity: Not on file  Stress: Not on file  Social Connections: Not on file  Intimate Partner Violence: Not on file    Allergies:  No Known Allergies  Medications: Prior to Admission medications   Medication Sig Start Date End Date Taking? Authorizing Provider  clobetasol (TEMOVATE) 0.05 % external solution Apply topically. 05/09/20   [provider]  clorazepate (TRANXENE) 7.5 MG tablet continuously as needed. 11/23/13   [provider]  naproxen (NAPROSYN) 500 MG tablet Take 500 mg by  mouth 2 (two) times daily. Patient not taking: Reported on 07/03/2020 06/07/19   [provider]    Physical Exam Vitals: Blood pressure 125/72, height 5\' 8"  (1.727 m), weight 233 lb 9.6 oz (106 kg).  General: NAD HEENT: normocephalic, anicteric, obese Thyroid: no enlargement, no palpable nodules Pulmonary: No increased work of breathing, CTAB Cardiovascular: RRR, distal pulses 2+ Breast: Breast symmetrical,  B cup size with flattened nipples,no tenderness, no palpable nodules or masses, no skin or nipple retraction present, no nipple discharge.  No axillary or supraclavicular lymphadenopathy.'areas of fungal rash under both breasts Abdomen: NABS, soft, non-tender, non-distended.  Umbilicus without lesions.  No hepatomegaly, splenomegaly or masses palpable. No evidence of hernia  Genitourinary:  External: Normal external female genitalia.  Some menopausal changes evident- thinnin gof the labiaNormal urethral meatus, normal Bartholin's and Skene's glands.    Vagina: Normal vaginal mucosa, no evidence of prolapse.    Cervix: Grossly normal in appearance, no bleeding  Uterus: Non-enlarged, mobile, normal contour.  No CMT  Adnexa: ovaries non-enlarged, no adnexal masses  Rectal: deferred  Lymphatic: no evidence of inguinal lymphadenopathy Extremities: no edema, erythema, or tenderness Neurologic: Grossly intact Psychiatric: mood appropriate, affect full  Female chaperone present for pelvic and breast  portions of the physical exam     Assessment: 60 y.o. G0P0000 routine annual exam  Plan: Problem List Items Addressed This Visit   None Visit Diagnoses     Women's annual routine gynecological examination    -  Primary   Cervical cancer screening           1) Mammogram - recommend yearly screening mammogram.  Mammogram Was ordered today  2) STI screening  wasoffered and declined  3) ASCCP guidelines and rational discussed.  Patient opts for yearly screening  interval  4) Osteoporosis  - per USPTF routine screening DEXA at age 62   Consider FDA-approved medical therapies in postmenopausal women and men aged 73 years and older, based on the following: a) A hip or vertebral (clinical or morphometric) fracture b) T-score ? -2.5 at the femoral neck or spine after appropriate evaluation to exclude secondary causes C) Low bone mass (T-score between -1.0 and -2.5 at the femoral neck or spine) and a 10-year probability of a hip fracture ? 3% or a 10-year probability of a major osteoporosis-related fracture ? 20% based on the [place]-adapted WHO algorithm   5) Routine healthcare maintenance including cholesterol, diabetes screening discussed managed by PCP  6) Colonoscopy ordered by her PCP.  Screening recommended starting at age 72 for average risk individuals, age 18 for individuals deemed at increased risk (including African Americans) and recommended to continue until age 18.  For patient age 96-85 individualized approach is recommended.  Gold standard screening is via colonoscopy, Cologuard screening  is an acceptable alternative for patient unwilling or unable to undergo colonoscopy.  "Colorectal cancer screening for average?risk adults: 2018 guideline update from the American Cancer Society"CA: A Cancer Journal for Clinicians: Jun 02, 2016   7) Return in about 1 year (around 07/03/2021) for annual.   Mirna Mires, CNM  07/03/2020 10:15 AM   Domingo Pulse, [facility] 07/03/2020, 10:06 AM

## 2020-07-09 ENCOUNTER — Encounter: Payer: Self-pay | Admitting: Obstetrics

## 2020-07-09 LAB — CYTOLOGY - PAP
Comment: NEGATIVE
Diagnosis: NEGATIVE
High risk HPV: NEGATIVE

## 2020-07-11 ENCOUNTER — Other Ambulatory Visit: Payer: Self-pay

## 2020-07-11 ENCOUNTER — Ambulatory Visit (INDEPENDENT_AMBULATORY_CARE_PROVIDER_SITE_OTHER): Payer: Managed Care, Other (non HMO) | Admitting: Vascular Surgery

## 2020-07-11 VITALS — BP 137/81 | HR 64 | Resp 16 | Ht 66.0 in | Wt 226.0 lb

## 2020-07-11 DIAGNOSIS — E119 Type 2 diabetes mellitus without complications: Secondary | ICD-10-CM

## 2020-07-11 DIAGNOSIS — I83813 Varicose veins of bilateral lower extremities with pain: Secondary | ICD-10-CM | POA: Diagnosis not present

## 2020-07-11 DIAGNOSIS — L409 Psoriasis, unspecified: Secondary | ICD-10-CM | POA: Diagnosis not present

## 2020-07-11 NOTE — Assessment & Plan Note (Signed)
Has some rash and skin changes on her legs from psoriasis.  If she does get an open ulceration with her venous insufficiency, this is going to be much more difficult to heal.

## 2020-07-11 NOTE — Assessment & Plan Note (Signed)
blood glucose control important in reducing the progression of atherosclerotic disease. Also, involved in wound healing. On appropriate medications.  

## 2020-07-11 NOTE — Progress Notes (Signed)
Patient ID: Hannah Cobb, female   DOB: 12/03/1960, 60 y.o.   MRN: [id number]  Chief Complaint  Patient presents with   New Patient (Initial Visit)    Varicose veins of BLE    HPI Hannah Cobb is a 60 y.o. female.  I am asked to see the patient by L. Bliss for evaluation of painful varicose veins.  The patient presents with complaints of symptomatic varicosities of the lower extremities for many years. The patient reports a long standing history of varicosities and they have become painful over time. There was no clear inciting event or causative factor that started the symptoms.  The right leg is more severly affected. The patient elevates the legs for relief. The pain is described as heaviness and aching and she has lots of cramps at night in both legs. The symptoms are generally most severe in the evening, particularly when they have been on their feet for long periods of time.  She has been wearing compression stockings for 12 years.  Elevation and compression have been used to try to improve the symptoms with limited success. The patient complains of worsening swelling as an associated symptom. The patient has no previous history of deep venous thrombosis or superficial thrombophlebitis to their knowledge.     Past Medical History:  Diagnosis Date   Allergic rhinitis    Diabetes mellitus without complication (HCC)    GERD (gastroesophageal reflux disease)    Psoriasis     Past Surgical History:  Procedure Laterality Date   COLONOSCOPY  2009   diverticulosis   ORIF ANKLE FRACTURE Left Dec    jan 1991-removal of hardware   SEPTOPLASTY  1985   deviated septum   TONSILLECTOMY AND ADENOIDECTOMY  1978    Family History  Problem Relation Age of Onset   Cirrhosis Mother        nonalcoholic   Diabetes Mother    Heart disease Mother    Hypertension Mother    Osteoporosis Mother    Neuropathy Mother    Hypertension Father    Lymphoma Brother 33   Diabetes Maternal  Grandfather    Hypertension Maternal Grandfather    Heart disease Maternal Grandfather    Diabetes Paternal Grandmother    Hypertension Maternal Grandmother    Heart disease Maternal Grandmother       Social History   Tobacco Use   Smoking status: Never   Smokeless tobacco: Never  Vaping Use   Vaping Use: Never used  Substance Use Topics   Alcohol use: No   Drug use: No     No Known Allergies  Current Outpatient Medications  Medication Sig Dispense Refill   clobetasol (TEMOVATE) 0.05 % external solution Apply topically.     clorazepate (TRANXENE) 7.5 MG tablet continuously as needed.     naproxen (NAPROSYN) 500 MG tablet Take 500 mg by mouth 2 (two) times daily. (Patient not taking: Reported on 07/03/2020)     No current facility-administered medications for this visit.      REVIEW OF SYSTEMS (Negative unless checked)  Constitutional: [] Weight loss  [] Fever  [] Chills Cardiac: [] Chest pain   [] Chest pressure   [] Palpitations   [] Shortness of breath when laying flat   [] Shortness of breath at rest   [] Shortness of breath with exertion. Vascular:  [] Pain in legs with walking   [] Pain in legs at rest   [] Pain in legs when laying flat   [] Claudication   [] Pain in feet when walking  []   Pain in feet at rest  [] Pain in feet when laying flat   [] History of DVT   [] Phlebitis   [x] Swelling in legs   [x] Varicose veins   [] Non-healing ulcers Pulmonary:   [] Uses home oxygen   [] Productive cough   [] Hemoptysis   [] Wheeze  [] COPD   [] Asthma Neurologic:  [] Dizziness  [] Blackouts   [] Seizures   [] History of stroke   [] History of TIA  [] Aphasia   [] Temporary blindness   [] Dysphagia   [] Weakness or numbness in arms   [] Weakness or numbness in legs Musculoskeletal:  [] Arthritis   [] Joint swelling   [] Joint pain   [] Low back pain Hematologic:  [] Easy bruising  [] Easy bleeding   [] Hypercoagulable state   [] Anemic  [] Hepatitis Gastrointestinal:  [] Blood in stool   [] Vomiting blood   [] Gastroesophageal reflux/heartburn   [] Abdominal pain Genitourinary:  [] Chronic kidney disease   [] Difficult urination  [] Frequent urination  [] Burning with urination   [] Hematuria Skin:  [] Rashes   [] Ulcers   [] Wounds Psychological:  [] History of anxiety   []  History of major depression.    Physical Exam BP 137/81 (BP Location: Right Arm)   Pulse 64   Resp 16   Ht 5\' 6"  (1.676 m)   Wt 226 lb (102.5 kg)   BMI 36.48 kg/m  Gen:  WD/WN, NAD Head: [place]/AT, No temporalis wasting.  Ear/Nose/Throat: Hearing grossly intact Eyes: Sclera non-icteric. Conjunctiva clear Neck: Supple. Trachea midline Pulmonary:  Good air movement, no use of accessory muscles, respirations not labored.  Cardiac: RRR, No JVD Vascular: Varicosities extensive and measuring up to 3 mm in the right lower extremity        Varicosities extensive and measuring up to 2-3 mm in the left lower extremity Vessel Right Left  Radial Palpable Palpable                          PT Palpable Palpable  DP Palpable Palpable   Musculoskeletal: M/S 5/5 throughout.   Moderate stasis dermatitis changes are present both lower extremities.  1+ RLE edema.  1+ LLE edema Neurologic: Sensation grossly intact in extremities.  Symmetrical.  Speech is fluent.  Psychiatric: Judgment intact, Mood & affect appropriate for pt's clinical situation. Dermatologic: No rashes or ulcers noted.  No cellulitis or open wounds.    Radiology No results found.  Labs Recent Results (from the past 2160 hour(s))  Cytology - PAP     Status: None   Collection Time: 07/03/20 10:17 AM  Result Value Ref Range   High risk HPV Negative    Adequacy      Satisfactory for evaluation; transformation zone component PRESENT.   Diagnosis      - Negative for intraepithelial lesion or malignancy (NILM)   Comment Normal Reference Range HPV - Negative     Assessment/Plan:  Varicose veins of leg with pain, bilateral  The patient has symptoms consistent with  chronic venous insufficiency. We discussed the natural history and treatment options for venous disease. I recommended the regular use of 20 - 30 mm Hg compression stockings, and the patient has been wearing these for over a decade. I recommended leg elevation and anti-inflammatories as needed for pain. I have also recommended a complete venous duplex to assess the venous system for reflux or thrombotic issues. This can be done at the patient's convenience. I will see the patient back after the duplex to assess the response to conservative management, and determine further treatment options.  Psoriasis Has some rash and skin changes on her legs from psoriasis.  If she does get an open ulceration with her venous insufficiency, this is going to be much more difficult to heal.  Diabetes mellitus without complication (HCC) blood glucose control important in reducing the progression of atherosclerotic disease. Also, involved in wound healing. On appropriate medications.      Festus Barren 07/11/2020, 11:06 AM   This note was created with Dragon medical transcription system.  Any errors from dictation are unintentional.

## 2020-07-11 NOTE — Assessment & Plan Note (Addendum)
The patient has symptoms consistent with chronic venous insufficiency. We discussed the natural history and treatment options for venous disease. I recommended the regular use of 20 - 30 mm Hg compression stockings, and the patient has been wearing these for over a decade. I recommended leg elevation and anti-inflammatories as needed for pain. I have also recommended a complete venous duplex to assess the venous system for reflux or thrombotic issues. This can be done at the patient's convenience. I will see the patient back after the duplex to assess the response to conservative management, and determine further treatment options.

## 2020-07-14 ENCOUNTER — Other Ambulatory Visit: Payer: Self-pay

## 2020-07-14 ENCOUNTER — Ambulatory Visit (INDEPENDENT_AMBULATORY_CARE_PROVIDER_SITE_OTHER): Payer: Managed Care, Other (non HMO)

## 2020-07-14 ENCOUNTER — Ambulatory Visit (INDEPENDENT_AMBULATORY_CARE_PROVIDER_SITE_OTHER): Payer: Managed Care, Other (non HMO) | Admitting: Nurse Practitioner

## 2020-07-14 VITALS — BP 152/81 | HR 63 | Ht 68.0 in | Wt 230.0 lb

## 2020-07-14 DIAGNOSIS — I83813 Varicose veins of bilateral lower extremities with pain: Secondary | ICD-10-CM

## 2020-07-14 DIAGNOSIS — E119 Type 2 diabetes mellitus without complications: Secondary | ICD-10-CM

## 2020-07-17 ENCOUNTER — Other Ambulatory Visit: Payer: Self-pay | Admitting: Obstetrics

## 2020-07-17 ENCOUNTER — Telehealth: Payer: Self-pay

## 2020-07-17 ENCOUNTER — Encounter: Payer: Self-pay | Admitting: Obstetrics

## 2020-07-17 DIAGNOSIS — Z1231 Encounter for screening mammogram for malignant neoplasm of breast: Secondary | ICD-10-CM

## 2020-07-17 NOTE — Telephone Encounter (Signed)
Per Allie at front desk, patient was advised by [facility] they couldn't schedule her as they do not have an order. Reviewed chart. Her last visit note states mammogram ordered. However, under imaging, it shows the order was cancelled on 07/03/20. Please replace orders for mammogram.

## 2020-07-18 NOTE — Telephone Encounter (Signed)
Patient aware.

## 2020-07-19 ENCOUNTER — Encounter (INDEPENDENT_AMBULATORY_CARE_PROVIDER_SITE_OTHER): Payer: Self-pay | Admitting: Nurse Practitioner

## 2020-07-19 NOTE — Progress Notes (Signed)
Subjective:    Patient ID: Hannah Cobb, female    DOB: 1960/07/07, 60 y.o.   MRN: [id number] Chief Complaint  Patient presents with   Follow-up    Pt conv  U/S     Hannah Cobb is a 60 year old female that is seen for evaluation of symptomatic varicose veins. The patient relates burning and stinging which worsened steadily throughout the course of the day, particularly with standing. The patient also notes an aching and throbbing pain over the varicosities, particularly with prolonged dependent positions. The symptoms are significantly improved with elevation.  The patient also notes that during hot weather the symptoms are greatly intensified. The patient states the pain from the varicose veins interferes with work, daily exercise, shopping and household maintenance. At this point, the symptoms are persistent and severe enough that they're having a negative impact on lifestyle and are interfering with daily activities.  There is no history of DVT, PE or superficial thrombophlebitis. There is no history of ulceration or hemorrhage. The patient denies a significant family history of varicose veins.   The patient has worn graduated compression for the past decade on a regular basis.  At the present time the patient has been using over-the-counter analgesics. There is no history of prior surgical intervention or sclerotherapy.  Today noninvasive studies show no evidence of DVT or superficial thrombophlebitis seen bilaterally.  No evidence of deep venous insufficiency seen bilaterally.  No evidence of superficial venous reflux seen in the left lower extremity.  The right has evidence of reflux in the great saphenous vein at the saphenofemoral junction and proximal thigh.  There is also varicosities at the mid to distal thigh which also exhibit reflux.   Review of Systems  Cardiovascular:  Positive for leg swelling.  All other systems reviewed and are negative.     Objective:   Physical  Exam Vitals reviewed.  HENT:     Head: Normocephalic.  Cardiovascular:     Rate and Rhythm: Normal rate.     Pulses: Normal pulses.  Pulmonary:     Effort: Pulmonary effort is normal.  Skin:    Comments: Varicosity on the right lower extremity with other scattered varicosities  Neurological:     Mental Status: She is alert and oriented to person, place, and time. Mental status is at baseline.  Psychiatric:        Mood and Affect: Mood normal.        Behavior: Behavior normal.        Thought Content: Thought content normal.        Judgment: Judgment normal.    BP (!) 152/81   Pulse 63   Ht 5\' 8"  (1.727 m)   Wt 230 lb (104.3 kg)   BMI 34.97 kg/m   Past Medical History:  Diagnosis Date   Allergic rhinitis    Diabetes mellitus without complication (HCC)    GERD (gastroesophageal reflux disease)    Psoriasis     Social History   Socioeconomic History   Marital status: Married    Spouse name: since 2004   Number of children: Not on file   Years of education: Not on file   Highest education level: Not on file  Occupational History   Occupation: Lab Tech  Tobacco Use   Smoking status: Never   Smokeless tobacco: Never  Vaping Use   Vaping Use: Never used  Substance and Sexual Activity   Alcohol use: No   Drug use: No  Sexual activity: Yes    Partners: Male    Birth control/protection: Post-menopausal  Other Topics Concern   Not on file  Social History Narrative   Not on file   Social Determinants of Health   Financial Resource Strain: Not on file  Food Insecurity: Not on file  Transportation Needs: Not on file  Physical Activity: Not on file  Stress: Not on file  Social Connections: Not on file  Intimate Partner Violence: Not on file    Past Surgical History:  Procedure Laterality Date   COLONOSCOPY  2009   diverticulosis   ORIF ANKLE FRACTURE Left Dec    jan 1991-removal of hardware   SEPTOPLASTY  1985   deviated septum   TONSILLECTOMY  AND ADENOIDECTOMY  1978    Family History  Problem Relation Age of Onset   Cirrhosis Mother        nonalcoholic   Diabetes Mother    Heart disease Mother    Hypertension Mother    Osteoporosis Mother    Neuropathy Mother    Hypertension Father    Lymphoma Brother 67   Diabetes Maternal Grandfather    Hypertension Maternal Grandfather    Heart disease Maternal Grandfather    Diabetes Paternal Grandmother    Hypertension Maternal Grandmother    Heart disease Maternal Grandmother     No Known Allergies  CBC Latest Ref Rng & Units 12/22/2017  WBC 4.0 - 10.5 K/uL 6.1  Hemoglobin 12.0 - 15.0 g/dL 59.1  Hematocrit 63.8 - 46.0 % 40.4  Platelets 150 - 400 K/uL 248      CMP     Component Value Date/Time   NA 138 12/22/2017 1002   K 4.2 12/22/2017 1002   CL 102 12/22/2017 1002   CO2 27 12/22/2017 1002   GLUCOSE 148 (H) 12/22/2017 1002   BUN 14 12/22/2017 1002   CREATININE 0.66 12/22/2017 1002   CALCIUM 9.2 12/22/2017 1002   GFRNONAA >60 12/22/2017 1002   GFRAA >60 12/22/2017 1002     No results found.     Assessment & Plan:   1. Varicose veins of leg with pain, bilateral Recommend  I have reviewed my previous  discussion with the patient regarding  varicose veins and why they cause symptoms. Patient will continue  wearing graduated compression stockings class 1 on a daily basis, beginning first thing in the morning and removing them in the evening.    In addition, behavioral modification including elevation during the day was again discussed and this will continue.  The patient has utilized over the counter pain medications and has been exercising.  However, at this time conservative therapy has not alleviated the patient's symptoms of leg pain and swelling  Recommend: laser ablation of the right great saphenous veins to eliminate the symptoms of pain and swelling of the lower extremities caused by the severe superficial venous reflux disease.    2. Diabetes  mellitus without complication (HCC) Continue hypoglycemic medications as already ordered, these medications have been reviewed and there are no changes at this time.  Hgb A1C to be monitored as already arranged by primary service    Current Outpatient Medications on File Prior to Visit  Medication Sig Dispense Refill   clobetasol (TEMOVATE) 0.05 % external solution Apply topically.     clorazepate (TRANXENE) 7.5 MG tablet continuously as needed.     naproxen (NAPROSYN) 500 MG tablet Take 500 mg by mouth 2 (two) times daily.     No current facility-administered  medications on file prior to visit.    There are no Patient Instructions on file for this visit. No follow-ups on file.   Kris Hartmann, NP

## 2021-03-18 ENCOUNTER — Telehealth: Payer: Self-pay

## 2021-03-18 NOTE — Telephone Encounter (Signed)
Patient has had sleep study 5+ years ago however states the sleep study machine malfunctioned and would like to repeat. Patient does not remember the office who set up HST.   ?

## 2021-03-19 ENCOUNTER — Ambulatory Visit: Payer: Managed Care, Other (non HMO) | Admitting: Adult Health

## 2021-03-19 ENCOUNTER — Encounter: Payer: Self-pay | Admitting: Adult Health

## 2021-03-19 ENCOUNTER — Other Ambulatory Visit: Payer: Self-pay

## 2021-03-19 VITALS — BP 118/72 | HR 70 | Temp 97.8°F | Ht 68.0 in | Wt 262.0 lb

## 2021-03-19 DIAGNOSIS — R400 Somnolence: Secondary | ICD-10-CM | POA: Diagnosis not present

## 2021-03-19 DIAGNOSIS — G4719 Other hypersomnia: Secondary | ICD-10-CM | POA: Diagnosis not present

## 2021-03-19 NOTE — Assessment & Plan Note (Signed)
Healthy weight loss encouraged 

## 2021-03-19 NOTE — Assessment & Plan Note (Addendum)
Daytime sleepiness, snoring, restless sleep, BMI 39 all suspicious for underlying sleep apnea. ?Patient previously had a home sleep study that was not diagnostic.  Patient has significant symptom burden we will set up for a in lab split-night sleep study. ? ?- discussed how weight can impact sleep and risk for sleep disordered breathing ?- discussed options to assist with weight loss: combination of diet modification, cardiovascular and strength training exercises ?  ?- had an extensive discussion regarding the adverse health consequences related to untreated sleep disordered breathing ?- specifically discussed the risks for hypertension, coronary artery disease, cardiac dysrhythmias, cerebrovascular disease, and diabetes ?- lifestyle modification discussed ?  ?- discussed how sleep disruption can increase risk of accidents, particularly when driving ?- safe driving practices were discussed ?  ? ?Plan  ?Patient Instructions  ?Set up for split night sleep study  ?Work on healthy weight loss.  ?Do not drive if sleepy ?Follow up in 6 weeks for study results and treatment plan  ? ? ?  ? ?

## 2021-03-19 NOTE — Patient Instructions (Addendum)
Set up for split night sleep study  ?Work on healthy weight loss.  ?Do not drive if sleepy ?Follow up in 6 weeks for study results and treatment plan  ? ? ?

## 2021-03-19 NOTE — Progress Notes (Signed)
Reviewed and agree with assessment/plan. ? ? ?Mahogany Torrance, MD ?[facility] ?03/19/2021, 11:16 AM ?Pager:  [phone] ? ?

## 2021-03-19 NOTE — Progress Notes (Signed)
? ?@Patient  ID: , female    DOB: April 11, 1960, 61 y.o.   MRN: [id number] ? ?Chief Complaint  ?Patient presents with  ? Follow-up  ? ? ?Referring provider: ?[id number], MD ? ?HPI: ?61 year old female presents for sleep consult March 19, 2021 for daytime sleepiness, restless sleep, snoring ? ?TEST/EVENTS :  ? ?03/19/2021 Sleep consult  ?Patient presents for a sleep consult.  Patient complains of ongoing restless sleep, snoring, daytime sleepiness.  Typically goes to bed about 10 PM.  Takes about an hour to go to sleep.  Is up 3-5 times a night.  And gets up about 6 AM.  Patient says her weights been steady over the last couple years.  She says she has had a sleep study in the past. Was told she did not have OSA , but says the HST machine did not work well . She did not trust results. She snores terrible for greater than 59yrs. Goes to sleep very easily during daytime . Interrupts husband sleep. She is not on CPAP. ?Denies any symptoms suspicious for cataplexy or sleep paralysis. ?Caffeine intake 4 cups of coffee .  ?Epworth score is 4.  Mainly sleepiness with inactivity. ? ?Past medical history: Psoriasis, GERD , Borderline DM,. Obesity . Anxiety ,  ? ?Surgical history had sinus surgery 1985., left leg fracture s/p repair ,  tonsillectomy 1978  ? ?Social history patient is married.  She is retired- worked at [employer] .  She drinks alcohol socially.  She is a never smoker. Travels a lot. Loves retirement . No drug use  ?2 step kids .  ? ?Went to [place] last year, great trip.  ? ?No Known Allergies ? ?Immunization History  ?Administered Date(s) Administered  ? PFIZER(Purple Top)SARS-COV-2 Vaccination 02/08/2020, 02/29/2020  ? Td 12/04/2015  ? ? ?Past Medical History:  ?Diagnosis Date  ? Allergic rhinitis   ? Diabetes mellitus without complication (HCC)   ? GERD (gastroesophageal reflux disease)   ? Psoriasis   ? ? ?Tobacco History: ?Social History  ? ?Tobacco Use  ?Smoking Status Never  ?Smokeless Tobacco  Never  ? ?Counseling given: Not Answered ? ? ?Outpatient Medications Prior to Visit  ?Medication Sig Dispense Refill  ? clobetasol (TEMOVATE) 0.05 % external solution Apply topically.    ? clorazepate (TRANXENE) 7.5 MG tablet continuously as needed.    ? naproxen (NAPROSYN) 500 MG tablet Take 500 mg by mouth 2 (two) times daily.    ? ?No facility-administered medications prior to visit.  ? ? ? ?Review of Systems:  ? ?Constitutional:   No  weight loss, night sweats,  Fevers, chills,  ?+fatigue, or  lassitude. ? ?HEENT:   No headaches,  Difficulty swallowing,  Tooth/dental problems, or  Sore throat,  ?              No sneezing, itching, ear ache, nasal congestion, post nasal drip,  ? ?CV:  No chest pain,  Orthopnea, PND, swelling in lower extremities, anasarca, dizziness, palpitations, syncope.  ? ?GI  No heartburn, indigestion, abdominal pain, nausea, vomiting, diarrhea, change in bowel habits, loss of appetite, bloody stools.  ? ?Resp: No shortness of breath with exertion or at rest.  No excess mucus, no productive cough,  No non-productive cough,  No coughing up of blood.  No change in color of mucus.  No wheezing.  No chest wall deformity ? ?Skin: +psoriasis  ? ?GU: no dysuria, change in color of urine, no urgency or frequency.  No flank  pain, no hematuria  ? ?MS:  No joint pain or swelling.  No decreased range of motion.  No back pain. ? ? ? ?Physical Exam ? ?BP 118/72 (BP Location: Left Arm, Patient Position: Sitting, Cuff Size: Normal)   Pulse 70   Temp 97.8 ?F (36.6 ?C) (Oral)   Ht 5\' 8"  (1.727 m)   Wt 262 lb (118.8 kg)   SpO2 97%   BMI 39.84 kg/m?  ? ?GEN: A/Ox3; pleasant , NAD, well nourished  ?  ?HEENT:  [place]/AT,  NOSE-clear, THROAT-clear, no lesions, no postnasal drip or exudate noted. Class 3 MP airway  ? ?NECK:  Supple w/ fair ROM; no JVD; normal carotid impulses w/o bruits; no thyromegaly or nodules palpated; no lymphadenopathy.   ? ?RESP  Clear  P & A; w/o, wheezes/ rales/ or rhonchi. no accessory  muscle use, no dullness to percussion ? ?CARD:  RRR, no m/r/g, no peripheral edema, pulses intact, no cyanosis or clubbing. ? ?GI:   Soft & nt; nml bowel sounds; no organomegaly or masses detected.  ? ?Musco: Warm bil, no deformities or joint swelling noted.  ? ?Neuro: alert, no focal deficits noted.   ? ?Skin: Warm, no lesions or rashes ? ? ? ?Lab Results: ? ?CBC ? ? ?BMET ? ? ?BNP ?No results found for: BNP ? ?ProBNP ?No results found for: PROBNP ? ?Imaging: ?No results found. ? ? ? ?No flowsheet data found. ? ?No results found for: NITRICOXIDE ? ? ? ? ? ?Assessment & Plan:  ? ?Daytime sleepiness ?Daytime sleepiness, snoring, restless sleep, BMI 39 all suspicious for underlying sleep apnea. ?Patient previously had a home sleep study that was not diagnostic.  Patient has significant symptom burden we will set up for a in lab split-night sleep study. ? ?- discussed how weight can impact sleep and risk for sleep disordered breathing ?- discussed options to assist with weight loss: combination of diet modification, cardiovascular and strength training exercises ?  ?- had an extensive discussion regarding the adverse health consequences related to untreated sleep disordered breathing ?- specifically discussed the risks for hypertension, coronary artery disease, cardiac dysrhythmias, cerebrovascular disease, and diabetes ?- lifestyle modification discussed ?  ?- discussed how sleep disruption can increase risk of accidents, particularly when driving ?- safe driving practices were discussed ?  ? ?Plan  ?Patient Instructions  ?Set up for split night sleep study  ?Work on healthy weight loss.  ?Do not drive if sleepy ?Follow up in 6 weeks for study results and treatment plan  ? ? ?  ? ? ?Morbid obesity (HCC) ?Healthy weight loss encouraged  ? ? ?I spent   31 minutes dedicated to the care of this patient on the date of this encounter to include pre-visit review of records, face-to-face time with the patient discussing  conditions above, post visit ordering of testing, clinical documentation with the electronic health record, making appropriate referrals as documented, and communicating necessary findings to members of the patients care team.  ? ? ? , NP ?03/19/2021 ? ?

## 2021-05-05 ENCOUNTER — Telehealth: Payer: Self-pay | Admitting: Adult Health

## 2021-05-21 NOTE — Telephone Encounter (Signed)
Noted     Closing encounter

## 2021-06-25 ENCOUNTER — Telehealth: Payer: Self-pay | Admitting: Adult Health

## 2021-06-25 DIAGNOSIS — G4719 Other hypersomnia: Secondary | ICD-10-CM

## 2021-07-01 NOTE — Telephone Encounter (Signed)
Please advise if you are okay with [place] ordering HST.

## 2021-07-06 NOTE — Telephone Encounter (Signed)
Order for HST has been placed. Nothing further needed.

## 2021-07-06 NOTE — Telephone Encounter (Signed)
Tammy, please advise if okay to order HST since insurance denied split night?

## 2021-07-06 NOTE — Telephone Encounter (Signed)
Yes home sleep study is fine

## 2021-12-14 ENCOUNTER — Ambulatory Visit: Payer: Managed Care, Other (non HMO) | Admitting: Family Medicine

## 2021-12-14 ENCOUNTER — Encounter: Payer: Self-pay | Admitting: Family Medicine

## 2021-12-14 DIAGNOSIS — Z7689 Persons encountering health services in other specified circumstances: Secondary | ICD-10-CM | POA: Diagnosis not present

## 2021-12-14 DIAGNOSIS — K219 Gastro-esophageal reflux disease without esophagitis: Secondary | ICD-10-CM | POA: Diagnosis not present

## 2021-12-14 DIAGNOSIS — E1169 Type 2 diabetes mellitus with other specified complication: Secondary | ICD-10-CM | POA: Diagnosis not present

## 2021-12-14 DIAGNOSIS — F411 Generalized anxiety disorder: Secondary | ICD-10-CM

## 2021-12-14 LAB — POCT GLYCOSYLATED HEMOGLOBIN (HGB A1C): Hemoglobin A1C: 8.9 % — AB (ref 4.0–5.6)

## 2021-12-14 MED ORDER — CLORAZEPATE DIPOTASSIUM 7.5 MG PO TABS: 7.5000 mg | ORAL_TABLET | Freq: Two times a day (BID) | ORAL | 5 refills | Status: AC | PRN

## 2021-12-14 MED ORDER — ESOMEPRAZOLE MAGNESIUM 20 MG PO CPDR: 20.0000 mg | DELAYED_RELEASE_CAPSULE | Freq: Every day | ORAL | 3 refills | Status: DC

## 2021-12-14 NOTE — Patient Instructions (Addendum)
Thank you for coming to the office today.  Recent Labs    12/14/21 1104  HGBA1C 8.9*   Keep on Metformin 2 a day for now.  Refilled Clorazepate for as needed  Call insurance find cost and coverage of the following - check the following: - Drug Tier, Preferred List, On Formulary - All will require a "Prior Authorization" from [place] first, before you can find out the cost - Find out if there is "Step Therapy" (other medicines required before you can try these)  Once you pick the one you want to try, let me know - we can get a sample ready IF we have it in stock. Then try it - and before running out of medicine, contact me back to order your Rx so we have time to get it processed.  For Pre-Diabetes / Diabetes   1. Ozempic (Semaglutide injection) - start 0.76m weekly for 4 weeks then increase to 0.527mweekly, sample is 6 doses, re-use the same pen until empty, new needle each dose.   2. Trulicity (Dulaglutide) - once weekly 0.75 (likely we would start) and 1.5 max dose, sample is 2 doses, 1 dose per pen, each pen is a one time use, no visible needle, it is an auto-injector.  3. Mounjaro (Tirzepatide) - once weekly injection - 2.19m41meekly (sample starter kit has 4 pens in it for 4 weeks) we would increase the dose monthly if doing well up to 19mg21men eventually 7.5 or 10  Regardless of which one we choose, you can keep metformin for the first few weeks, and then as the dose on the shot increases, you would decrease metformin to 1 a day for a while then off if doing well.  4. Rybelsus (pill - oral semaglutide or ozempic) - once daily, taken first thing in morning without other meds    3 benefits - 1 significantly reduced A1c sugar, and may be able to reduce or stop metformin in future - 2 reduced appetite and weight loss with good results - 3 cardiovascular risk reduction, less likely to have heart attack/stroke    Please schedule a Follow-up Appointment to: Return in about 4 months  (around 04/02/2022) for Follow-up 3 months, on 04/02/22 (same day as her husband but diff time) - DM A1c.  If you have any other questions or concerns, please feel free to call the office or send a message through [facility] may also schedule an earlier appointment if necessary.  Additionally, you may be receiving a survey about your experience at our office within a few days to 1 week by e-mail or mail. We value your feedback.  AlexNobie Putnam [place]

## 2021-12-14 NOTE — Progress Notes (Signed)
Subjective:    Patient ID: Hannah Cobb, female    DOB: [birth year], 61 y.o.   MRN: [id number]  Hannah Cobb is a 61 y.o. female presenting on 12/14/2021 for Establish Care   HPI  Previously followed by Dr Quillian Quince  Morbid Obesity BMI >40 Exercise regimen, Walking 1 mile daily, if skip day will do 2 mile next day.  CHRONIC HTN: Reports elevated BP Current Meds - None   Denies CP, dyspnea, HA, edema, dizziness / lightheadedness  CHRONIC DM, Type 2: Reports concerns prior A1c 8 range, due today Meds: Metformin IR 500mg  x 2 daily, previously max 4 per day Reports good compliance. Tolerating well w/o side-effects Improving lifestyle Denies hypoglycemia, polyuria, visual changes, numbness or tingling.  Generalized anxiety Chronic problem, improved overall, uses med intermittently AS NEEDED with good results, takes few times per month max  GERD On OTC Nexium 20mg   Takes OTC Women's MVI  Scalp Psoriasis, severe   Health Maintenance: Upcoming health maintenance discussion at next visit      No data to display          Past Medical History:  Diagnosis Date   Allergic rhinitis    Diabetes mellitus without complication (HCC)    GERD (gastroesophageal reflux disease)    Psoriasis    Sleep apnea    Past Surgical History:  Procedure Laterality Date   COLONOSCOPY  2009   diverticulosis   ORIF ANKLE FRACTURE Left Dec    jan 1991-removal of hardware   SEPTOPLASTY  1985   deviated septum   TONSILLECTOMY AND ADENOIDECTOMY  1978   Social History   Socioeconomic History   Marital status: Married    Spouse name: 2010 since 2004   Number of children: Not on file   Years of education: Not on file   Highest education level: Not on file  Occupational History   Occupation: Lab Tech  Tobacco Use   Smoking status: Never   Smokeless tobacco: Never  Vaping Use   Vaping Use: Never used  Substance and Sexual Activity   Alcohol use: Yes    Alcohol/week: 1.0  standard drink of alcohol    Types: 1 Standard drinks or equivalent per week   Drug use: No   Sexual activity: Yes    Partners: Male    Birth control/protection: Post-menopausal  Other Topics Concern   Not on file  Social History Narrative   Not on file   Social Determinants of Health   Financial Resource Strain: Not on file  Food Insecurity: Not on file  Transportation Needs: Not on file  Physical Activity: Inactive (02/24/2017)   Exercise Vital Sign    Days of Exercise per Week: 0 days    Minutes of Exercise per Session: 0 min  Stress: No Stress Concern Present (02/24/2017)   02/26/2017 of Occupational Health - Occupational Stress Questionnaire    Feeling of Stress : Only a little  Social Connections: Socially Integrated (02/24/2017)   Social Connection and Isolation Panel [NHANES]    Frequency of Communication with Friends and Family: More than three times a week    Frequency of Social Gatherings with Friends and Family: More than three times a week    Attends Religious Services: More than 4 times per year    Active Member of [employer] or Organizations: Yes    Attends 02/26/2017: More than 4 times per year    Marital Status: Married  [employer] Violence: Not At Risk (  02/24/2017)   Humiliation, Afraid, Rape, and Kick questionnaire    Fear of Current or Ex-Partner: No    Emotionally Abused: No    Physically Abused: No    Sexually Abused: No   Family History  Problem Relation Age of Onset   Cirrhosis Mother        nonalcoholic   Diabetes Mother    Heart disease Mother    Hypertension Mother    Osteoporosis Mother    Neuropathy Mother    Hypertension Father    Lymphoma Brother 41   Diabetes Maternal Grandfather    Hypertension Maternal Grandfather    Heart disease Maternal Grandfather    Diabetes Paternal Grandmother    Hypertension Maternal Grandmother    Heart disease Maternal Grandmother    Current Outpatient Medications on File Prior  to Visit  Medication Sig   ascorbic acid (VITAMIN C) 500 MG tablet Take 1,000 mg by mouth daily.   Cholecalciferol (VITAMIN D3) 10 MCG (400 UNIT) tablet Take 400 Units by mouth daily.   clobetasol (TEMOVATE) 0.05 % external solution Apply topically.   diphenhydrAMINE (BENADRYL) 50 MG capsule Take 50 mg by mouth at bedtime as needed.   gabapentin (NEURONTIN) 300 MG capsule Take 300 mg by mouth daily.   Multiple Vitamins-Minerals (CENTRUM WOMEN PO) Take by mouth.   naproxen (NAPROSYN) 500 MG tablet Take 500 mg by mouth 2 (two) times daily.   TREMFYA 100 MG/ML SOPN Inject 100 mg as directed.   No current facility-administered medications on file prior to visit.    Review of Systems Per HPI unless specifically indicated above     Objective:    BP (!) 146/88 (BP Location: Left Wrist, Cuff Size: Normal)   Pulse 70   Ht 5\' 8"  (1.727 m)   Wt 268 lb 3.2 oz (121.7 kg)   SpO2 96%   BMI 40.78 kg/m   Wt Readings from Last 3 Encounters:  12/14/21 268 lb 3.2 oz (121.7 kg)  03/19/21 262 lb (118.8 kg)  07/14/20 230 lb (104.3 kg)    Physical Exam Vitals and nursing note reviewed.  Constitutional:      General: She is not in acute distress.    Appearance: Normal appearance. She is well-developed. She is obese. She is not diaphoretic.     Comments: Well-appearing, comfortable, cooperative  HENT:     Head: Normocephalic and atraumatic.  Eyes:     General:        Right eye: No discharge.        Left eye: No discharge.     Conjunctiva/sclera: Conjunctivae normal.  Cardiovascular:     Rate and Rhythm: Normal rate.  Pulmonary:     Effort: Pulmonary effort is normal.  Skin:    General: Skin is warm and dry.     Findings: No erythema or rash.  Neurological:     Mental Status: She is alert and oriented to person, place, and time.  Psychiatric:        Mood and Affect: Mood normal.        Behavior: Behavior normal.        Thought Content: Thought content normal.     Comments: Well  groomed, good eye contact, normal speech and thoughts    Results for orders placed or performed in visit on 12/14/21  POCT glycosylated hemoglobin (Hb A1C)  Result Value Ref Range   Hemoglobin A1C 8.9 (A) 4.0 - 5.6 %      Assessment & Plan:  Problem List Items Addressed This Visit     Diabetes mellitus (HCC)   Relevant Orders   POCT glycosylated hemoglobin (Hb A1C) (Completed)   GERD (gastroesophageal reflux disease)   Relevant Medications   esomeprazole (NEXIUM) 20 MG capsule   Morbid obesity (HCC) - Primary   Other Visit Diagnoses     Encounter to establish care with new doctor       Generalized anxiety disorder       Relevant Medications   clorazepate (TRANXENE) 7.5 MG tablet       Establish care  Type 2 DIABETES Discussion today on managing her DIABETES A1c today POC was 8.9, prior 8.5 range Only on Metformin, discussion on next agents, she will check cost coverage on GLP1/GIP therapy and notify us with preferred med option and she can pick up sample of either Ozempic, Trulicity or Mounjaro and we can initiate therapy. - Will order rx after sample trial and continue Metformin until dose adjust over course of first 4-6 weeks depending on sugars.  GERD Continue Nexium 20mg  daily generic rx  GAD Anxiety Re order intermittent AS NEEDED Clorazepate 7.5mg -15mg  AS NEEDED rarely taking   Meds ordered this encounter  Medications   esomeprazole (NEXIUM) 20 MG capsule    Sig: Take 1 capsule (20 mg total) by mouth daily before breakfast.    Dispense:  90 capsule    Refill:  3   clorazepate (TRANXENE) 7.5 MG tablet    Sig: Take 1-2 tablets (7.5-15 mg total) by mouth 2 (two) times daily as needed for anxiety.    Dispense:  30 tablet    Refill:  5      Follow up plan: Return in about 4 months (around 04/02/2022) for Follow-up 3 months, on 04/02/22 (same day as her husband but diff time) - DM A1c.  04/04/2022, DO [facility] 12/14/2021, 10:36 AM

## 2021-12-22 ENCOUNTER — Telehealth: Payer: Self-pay

## 2021-12-22 NOTE — Telephone Encounter (Signed)
Thanks. Yes 1 box sample mounjaro 2.5mg  weekly inj, 4 pens in the box.   Saralyn Pilar, DO [facility] 12/22/2021, 1:25 PM

## 2021-12-22 NOTE — Telephone Encounter (Signed)
Sample box given to the patient.

## 2021-12-22 NOTE — Telephone Encounter (Signed)
The pt is in the office to pick up a sample of Mounjaro. She spoke with her insurance and they informed her that the Greggory Keen will be covered under her plan. Please advise

## 2022-01-08 ENCOUNTER — Telehealth: Payer: Self-pay | Admitting: Family Medicine

## 2022-01-08 DIAGNOSIS — E1169 Type 2 diabetes mellitus with other specified complication: Secondary | ICD-10-CM

## 2022-01-08 MED ORDER — MOUNJARO 5 MG/0.5ML ~~LOC~~ SOAJ: 5.0000 mg | SUBCUTANEOUS | 2 refills | Status: DC

## 2022-01-08 NOTE — Telephone Encounter (Signed)
Pt reports that she was given samples of the [facility] and she was told if it works then a Rx could be sent to her pharmacy. Pt requests that the Rx for [facility] be sent to  [facility] [employer] [address] Phone: [phone]  Fax: [phone]

## 2022-01-08 NOTE — Telephone Encounter (Signed)
New rx Mounjaro 5mg  weekly sent to her pharmacy [facility]. May need PA. Thank you  Nobie Putnam, DO [facility] 01/08/2022, 5:36 PM

## 2022-01-15 ENCOUNTER — Ambulatory Visit: Payer: Managed Care, Other (non HMO) | Admitting: Family Medicine

## 2022-01-15 ENCOUNTER — Encounter: Payer: Self-pay | Admitting: Family Medicine

## 2022-01-15 VITALS — BP 132/88 | HR 67 | Ht 68.0 in | Wt 257.0 lb

## 2022-01-15 DIAGNOSIS — E1169 Type 2 diabetes mellitus with other specified complication: Secondary | ICD-10-CM

## 2022-01-15 DIAGNOSIS — G43909 Migraine, unspecified, not intractable, without status migrainosus: Secondary | ICD-10-CM

## 2022-01-15 NOTE — Patient Instructions (Addendum)
Thank you for coming to the office today.  Mounjaro 5mg  current dose, use weekly for 4 weeks Pick up 1 more box from pharmacy of Mounjaro 5mg  for another 4 weeks. When complete, then switch to the Trulicity 1.5mg  SAMPLE until those run out, then start the Trulicity 1.5mg  weekly - prescription  When you receive your card, please come by the office, submit the card to [place], and also schedule a 3 month apt for Diabetes A1c check.  Also, please request at that time, a 90 day order Trulicity 1.5mg  to the mail order pharmacy.  Migraine  Medications to help stop a migraine  Nurtec ODT dissolving tablet, take one every 48 hours.  Zavzpret nasal one dose spray, every 24 hour.  Roselyn Meier - pill take 1 may repeat 1 in 2 hours if need, or can repeat next day 24 hours.   Please schedule a Follow-up Appointment to: Return for Future follow-up Diabetes A1c (3 months AFTER you bring in the new card).  If you have any other questions or concerns, please feel free to call the office or send a message through [facility]. You may also schedule an earlier appointment if necessary.  Additionally, you may be receiving a survey about your experience at our office within a few days to 1 week by e-mail or mail. We value your feedback.  Nobie Putnam, DO [place]

## 2022-01-15 NOTE — Progress Notes (Signed)
Subjective:    Patient ID: Hannah Cobb, female    DOB: [birth year], 62 y.o.   MRN: [id number]  Hannah Cobb is a 62 y.o. female presenting on 01/15/2022 for Diabetes, Medication Management, and Headache   HPI  CHRONIC DM, Type 2: Last A1c 8.9 12/2021 Improved on Mounjaro, issue with lack of insurance now on COBRA through 2/29 then new insurance. Will no longer cover Mounjaro Meds: Mounjaro 5mg  weekly inj Reports good compliance. Tolerating well w/o side-effects Improving lifestyle Denies hypoglycemia, polyuria, visual changes, numbness or tingling.  Migraines, episodic Has tried Excedrin migraine AS NEEDED limited relief Not on other meds 02/03/22 apt Neurology apt [facility].       No data to display          Social History   Tobacco Use   Smoking status: Never   Smokeless tobacco: Never  Vaping Use   Vaping Use: Never used  Substance Use Topics   Alcohol use: Yes    Alcohol/week: 1.0 standard drink of alcohol    Types: 1 Standard drinks or equivalent per week   Drug use: No    Review of Systems Per HPI unless specifically indicated above     Objective:    BP 132/88   Pulse 67   Ht 5\' 8"  (1.727 m)   Wt 257 lb (116.6 kg)   SpO2 98%   BMI 39.08 kg/m   Wt Readings from Last 3 Encounters:  01/15/22 257 lb (116.6 kg)  12/14/21 268 lb 3.2 oz (121.7 kg)  03/19/21 262 lb (118.8 kg)    Physical Exam Vitals and nursing note reviewed.  Constitutional:      General: She is not in acute distress.    Appearance: Normal appearance. She is well-developed. She is obese. She is not diaphoretic.     Comments: Well-appearing, comfortable, cooperative  HENT:     Head: Normocephalic and atraumatic.  Eyes:     General:        Right eye: No discharge.        Left eye: No discharge.     Conjunctiva/sclera: Conjunctivae normal.  Cardiovascular:     Rate and Rhythm: Normal rate.  Pulmonary:     Effort: Pulmonary effort is normal.  Skin:    General:  Skin is warm and dry.     Findings: No erythema or rash.  Neurological:     Mental Status: She is alert and oriented to person, place, and time.  Psychiatric:        Mood and Affect: Mood normal.        Behavior: Behavior normal.        Thought Content: Thought content normal.     Comments: Well groomed, good eye contact, normal speech and thoughts    Results for orders placed or performed in visit on 12/14/21  POCT glycosylated hemoglobin (Hb A1C)  Result Value Ref Range   Hemoglobin A1C 8.9 (A) 4.0 - 5.6 %      Assessment & Plan:   Problem List Items Addressed This Visit     Diabetes mellitus ([place]) - Primary   Episodic migraine    Type 2 Diabetes A1c 8.9, not at goal Improved on therapy Barrier w/ change loss of insurance > COBRA > new insurance  Has Mounjaro 5mg  current dose, use weekly for 4 weeks + 1 refill before COBRA ends  Sample Trulicity 1.5mg  weekly inj x 4 pen (1 month) today, start AFTER Mounjaro 5  When you  receive your new ins card, please come by the office, submit the card to [place], and also schedule a 3 month apt for Diabetes A1c check.  Also, please request at that time, a 90 day order Trulicity 1.5mg  to the mail order pharmacy.  Migraines, episodic  Samples, for abortive today for trial - Nurtec ODT x 4 pills, Ubrelvy 100mg  x 1 pill  F/u with Neurology as scheduled.  No orders of the defined types were placed in this encounter.     Follow up plan: Return for Future follow-up Diabetes A1c (3 months AFTER you bring in the new card).   Nobie Putnam, [facility] 01/15/2022, 4:32 PM

## 2022-02-01 NOTE — Progress Notes (Unsigned)
PCP: Olin Hauser, DO   No chief complaint on file.   HPI:      Ms. Hannah CARBERRY is a 62 y.o. G0P0000 whose LMP was No LMP recorded. Patient is postmenopausal., presents today for her annual examination.  Her menses are absent due to menopause. She {does:18564} have vasomotor sx.   Sex activity: {sex active: 315163}. She {does:18564} have vaginal dryness.  Last Pap: 07/03/20  Results were: no abnormalities /neg HPV DNA.  Hx of STDs: {STD hx:14358}  Last mammogram: 07/06/19  Results were: normal--routine follow-up in 12 months There is no FH of breast cancer. There is no FH of ovarian cancer. The patient {does:18564} do self-breast exams.  Colonoscopy: 2002  Repeat due after 10*** years.   Tobacco use: {tob:20664} Alcohol use: {Alcohol:11675} No drug use Exercise: {exercise:31265}  She {does:18564} get adequate calcium and Vitamin D in her diet.  Labs with PCP.   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   Obesity 06/22/2019   Psoriasis    GERD (gastroesophageal reflux disease)    Diabetes mellitus ([place])    Allergic rhinitis     Past Surgical History:  Procedure Laterality Date   COLONOSCOPY  2009   diverticulosis   ORIF ANKLE FRACTURE Left Dec    jan 1991-removal of hardware   SEPTOPLASTY  1985   deviated septum   TONSILLECTOMY AND ADENOIDECTOMY  1978    Family History  Problem Relation Age of Onset   Cirrhosis Mother        nonalcoholic   Diabetes Mother    Heart disease Mother    Hypertension Mother    Osteoporosis Mother    Neuropathy Mother    Hypertension Father    Lymphoma Brother 42   Diabetes Maternal Grandfather    Hypertension Maternal Grandfather    Heart disease Maternal Grandfather    Diabetes Paternal Grandmother    Hypertension Maternal Grandmother    Heart  disease Maternal Grandmother     Social History   Socioeconomic History   Marital status: Married    Spouse name: Saralyn Pilar since 2004   Number of children: Not on file   Years of education: Not on file   Highest education level: Not on file  Occupational History   Occupation: Lab Tech  Tobacco Use   Smoking status: Never   Smokeless tobacco: Never  Vaping Use   Vaping Use: Never used  Substance and Sexual Activity   Alcohol use: Yes    Alcohol/week: 1.0 standard drink of alcohol    Types: 1 Standard drinks or equivalent per week   Drug use: No   Sexual activity: Yes    Partners: Male    Birth control/protection: Post-menopausal  Other Topics Concern   Not on file  Social History Narrative   Not on file   Social Determinants of Health   Financial Resource Strain: Not on file  Food Insecurity: Not on file  Transportation Needs: Not on file  Physical Activity: Inactive (02/24/2017)   Exercise Vital Sign    Days of Exercise per Week: 0 days    Minutes of Exercise per Session: 0 min  Stress: No Stress Concern Present (02/24/2017)   [place]    Feeling of Stress : Only a little  Social Connections: Socially  Integrated (02/24/2017)   Social Connection and Isolation Panel [NHANES]    Frequency of Communication with Friends and Family: More than three times a week    Frequency of Social Gatherings with Friends and Family: More than three times a week    Attends Religious Services: More than 4 times per year    Active Member of [employer] or Organizations: Yes    Attends Music therapist: More than 4 times per year    Marital Status: Married  Human resources officer Violence: Not At Risk (02/24/2017)   Humiliation, Afraid, Rape, and Kick questionnaire    Fear of Current or Ex-Partner: No    Emotionally Abused: No    Physically Abused: No    Sexually Abused: No     Current Outpatient Medications:     ascorbic acid (VITAMIN C) 500 MG tablet, Take 1,000 mg by mouth daily., Disp: , Rfl:    Cholecalciferol (VITAMIN D3) 10 MCG (400 UNIT) tablet, Take 400 Units by mouth daily., Disp: , Rfl:    clobetasol (TEMOVATE) 0.05 % external solution, Apply topically., Disp: , Rfl:    clorazepate (TRANXENE) 7.5 MG tablet, Take 1-2 tablets (7.5-15 mg total) by mouth 2 (two) times daily as needed for anxiety., Disp: 30 tablet, Rfl: 5   diphenhydrAMINE (BENADRYL) 50 MG capsule, Take 50 mg by mouth at bedtime as needed., Disp: , Rfl:    esomeprazole (NEXIUM) 20 MG capsule, Take 1 capsule (20 mg total) by mouth daily before breakfast., Disp: 90 capsule, Rfl: 3   gabapentin (NEURONTIN) 300 MG capsule, Take 300 mg by mouth daily., Disp: , Rfl:    MOUNJARO 5 MG/0.5ML Pen, Inject 5 mg into the skin once a week., Disp: 2 mL, Rfl: 2   Multiple Vitamins-Minerals (CENTRUM WOMEN PO), Take by mouth., Disp: , Rfl:    naproxen (NAPROSYN) 500 MG tablet, Take 500 mg by mouth 2 (two) times daily., Disp: , Rfl:    TREMFYA 100 MG/ML SOPN, Inject 100 mg as directed., Disp: , Rfl:      ROS:  Review of Systems BREAST: No symptoms    Objective: There were no vitals taken for this visit.   OBGyn Exam  Results: No results found for this or any previous visit (from the past 24 hour(s)).  Assessment/Plan:  No diagnosis found.   No orders of the defined types were placed in this encounter.           GYN counsel {counseling: 16159}    F/U  No follow-ups on file.  Hannah Cobb B. Jeiry Birnbaum, PA-C 02/01/2022 8:09 PM

## 2022-02-02 ENCOUNTER — Ambulatory Visit: Payer: Managed Care, Other (non HMO) | Admitting: Obstetrics and Gynecology

## 2022-02-02 ENCOUNTER — Encounter: Payer: Self-pay | Admitting: Obstetrics and Gynecology

## 2022-02-02 VITALS — BP 125/69 | HR 67 | Ht 68.0 in | Wt 262.0 lb

## 2022-02-02 DIAGNOSIS — Z01419 Encounter for gynecological examination (general) (routine) without abnormal findings: Secondary | ICD-10-CM | POA: Diagnosis not present

## 2022-02-02 DIAGNOSIS — N930 Postcoital and contact bleeding: Secondary | ICD-10-CM | POA: Diagnosis not present

## 2022-02-02 DIAGNOSIS — N951 Menopausal and female climacteric states: Secondary | ICD-10-CM

## 2022-02-02 DIAGNOSIS — Z1211 Encounter for screening for malignant neoplasm of colon: Secondary | ICD-10-CM

## 2022-02-02 DIAGNOSIS — Z1231 Encounter for screening mammogram for malignant neoplasm of breast: Secondary | ICD-10-CM

## 2022-02-02 NOTE — Patient Instructions (Addendum)
I value your feedback and you entrusting us with your care. If you get a  patient survey, I would appreciate you taking the time to let us know about your experience today. Thank you!  [facility]: [phone]      

## 2022-02-23 ENCOUNTER — Ambulatory Visit
Admission: RE | Admit: 2022-02-23 | Discharge: 2022-02-23 | Disposition: A | Discharge status: Home / Self Care | Payer: Managed Care, Other (non HMO) | Source: Ambulatory Visit | Attending: Obstetrics and Gynecology | Admitting: Obstetrics and Gynecology

## 2022-02-23 DIAGNOSIS — Z1231 Encounter for screening mammogram for malignant neoplasm of breast: Secondary | ICD-10-CM | POA: Diagnosis present

## 2022-02-25 ENCOUNTER — Encounter: Payer: Self-pay | Admitting: Obstetrics and Gynecology

## 2022-03-04 ENCOUNTER — Telehealth: Payer: Self-pay

## 2022-03-04 NOTE — Telephone Encounter (Signed)
Copied from [place]. Topic: General - Other >> Mar 03, 2022 10:04 AM Sabas Sous wrote: Reason for CRM: Pt called and wants to know if she can receive another supply of mounjaro since it is currently still covered on her insurance. Says she is leaving town on Friday. Is interested in a sample because she only has one more injection left.   Requesting a call back from PCP   I attempted calling her but voicemail is full.   We do not currently have any samples and pharmacies are out of the medication and it is on backorder. Therefore, we don't have any to give her.

## 2022-03-23 ENCOUNTER — Other Ambulatory Visit: Payer: Self-pay | Admitting: Family Medicine

## 2022-03-23 DIAGNOSIS — K219 Gastro-esophageal reflux disease without esophagitis: Secondary | ICD-10-CM

## 2022-03-23 DIAGNOSIS — E1169 Type 2 diabetes mellitus with other specified complication: Secondary | ICD-10-CM

## 2022-03-23 DIAGNOSIS — F411 Generalized anxiety disorder: Secondary | ICD-10-CM

## 2022-03-23 NOTE — Telephone Encounter (Signed)
Patient called in stated she [facility] [address], [place], [place] [address] due to insurance changed to [place] and they only pay [facility].  Patient [facility] Tr[facility]icity [facility] to [facility] and [facility].

## 2022-03-23 NOTE — Telephone Encounter (Signed)
Patient called and asked which medications will need to be sent. For now she says she will need esomeprazole for acid reflux. All other medications she doesn't need at this time. Advised to call us when they are almost out so that [facility].

## 2022-03-24 MED ORDER — ESOMEPRAZOLE MAGNESIUM 20 MG PO CPDR: 20.0000 mg | DELAYED_RELEASE_CAPSULE | Freq: Every day | ORAL | 0 refills | Status: DC

## 2022-03-24 NOTE — Telephone Encounter (Signed)
[facility], per [facility] request. Due to insurance.  Requested Prescriptions  Pending Prescriptions Disp Refills   esomeprazole (NEXIUM) 20 MG capsule 90 capsule 0    Sig: Take 1 capsule (20 mg total) by mouth daily before breakfast.     Gastroenterology: Proton Pump Inhibitors 2 Failed - 03/23/2022  1:02 PM      Failed - ALT in normal range and within 360 days    No results found for: "ALT", "LABALT", "POCALT"       Failed - AST in normal range and within 360 days    No results found for: "POCAST", "AST"       Passed - Valid encounter within last 12 months    Recent Outpatient Visits           2 months ago Type 2 diabetes mellitus with other specified complication, without long-term current use of insulin [facility])   [place], Devonne Doughty, DO   3 months ago Morbid obesity [facility])   [place], [place]

## 2022-04-02 ENCOUNTER — Other Ambulatory Visit: Payer: Self-pay | Admitting: Family Medicine

## 2022-04-02 ENCOUNTER — Ambulatory Visit: Payer: Managed Care, Other (non HMO) | Admitting: Family Medicine

## 2022-04-02 DIAGNOSIS — E1169 Type 2 diabetes mellitus with other specified complication: Secondary | ICD-10-CM

## 2022-04-02 MED ORDER — TRULICITY 1.5 MG/0.5ML ~~LOC~~ SOAJ: 1.5000 mg | SUBCUTANEOUS | 2 refills | Status: DC

## 2022-04-12 ENCOUNTER — Telehealth: Payer: Self-pay

## 2022-04-12 DIAGNOSIS — E1169 Type 2 diabetes mellitus with other specified complication: Secondary | ICD-10-CM

## 2022-04-12 NOTE — Telephone Encounter (Signed)
Copied from CRM [id number]. Topic: General - Other >> Apr 12, 2022 10:54 AM Everette C wrote: Reason for CRM: The patient has been directed to contact their PCP and request prior authorization for their TRULICITY 1.5 MG/0.5ML SOPN [[id number]]  The patient has been told that additional information is needed for their prior authorization and it must be marked "urgent"  Please contact [phone] for additional assistance with their prior Authorization   The patient has shared that they will be traveling for two weeks leaving 04/14/22  Please contact the patient further when possible

## 2022-04-12 NOTE — Telephone Encounter (Signed)
PA has been submitted to Cover My Meds.   Thanks,   -Danta Baumgardner  

## 2022-04-22 ENCOUNTER — Other Ambulatory Visit: Payer: Self-pay | Admitting: Family Medicine

## 2022-04-22 DIAGNOSIS — K219 Gastro-esophageal reflux disease without esophagitis: Secondary | ICD-10-CM

## 2022-04-23 NOTE — Telephone Encounter (Signed)
Unable to refill per protocol, Rx request is too soon. Last refill 03/24/22 for 90 days.  Requested Prescriptions  Pending Prescriptions Disp Refills   esomeprazole (NEXIUM) 20 MG capsule [Pharmacy Med Name: ESOMEPRAZOLE MAG DR 20 MG CAP] 90 capsule 1    Sig: TAKE 1 CAPSULE BY MOUTH DAILY BEFORE BREAKFAST.     Gastroenterology: Proton Pump Inhibitors 2 Failed - 04/22/2022  1:30 PM      Failed - ALT in normal range and within 360 days    No results found for: "ALT", "LABALT", "POCALT"       Failed - AST in normal range and within 360 days    No results found for: "POCAST", "AST"       Passed - Valid encounter within last 12 months    Recent Outpatient Visits           3 months ago Type 2 diabetes mellitus with other specified complication, without long-term current use of insulin [facility])   [place] [facility] [place], Netta Neat, DO   4 months ago Morbid obesity [facility])   [place] [facility] [place], Netta Neat, [place]

## 2022-05-04 DIAGNOSIS — L400 Psoriasis vulgaris: Secondary | ICD-10-CM | POA: Diagnosis not present

## 2022-05-04 DIAGNOSIS — Z79899 Other long term (current) drug therapy: Secondary | ICD-10-CM | POA: Diagnosis not present

## 2022-05-07 ENCOUNTER — Telehealth: Payer: Self-pay | Admitting: Family Medicine

## 2022-05-07 MED ORDER — TRULICITY 0.75 MG/0.5ML ~~LOC~~ SOAJ
0.7500 mg | SUBCUTANEOUS | 0 refills | Status: DC
Start: 2022-05-07 — End: 2022-07-09

## 2022-05-07 NOTE — Telephone Encounter (Signed)
Please notify patient  [facility] and confirmed they have Trulicity 0.75mg  in stock, I sent a new order for 4 week box of trulicity 0.75. it will be lower dose, but that is okay to use until she returns and they get the 1.5 back in stock.   Saralyn Pilar, DO [facility] 05/07/2022, 4:35 PM

## 2022-05-07 NOTE — Addendum Note (Signed)
Addended by: Smitty Cords on: 05/07/2022 04:36 PM   Modules accepted: Orders

## 2022-05-07 NOTE — Telephone Encounter (Signed)
Patient informed of message  

## 2022-05-07 NOTE — Telephone Encounter (Signed)
Pt is calling to report that

## 2022-05-07 NOTE — Telephone Encounter (Signed)
Pt is calling to report that the Trulicity is on back order. Pt is going out of town on Sunday. And would like to know if Dr. Kirtland Bouchard would like to call a different medication to the pharamcy. Please advise CB- [phone]

## 2022-05-19 DIAGNOSIS — G4733 Obstructive sleep apnea (adult) (pediatric): Secondary | ICD-10-CM | POA: Diagnosis not present

## 2022-05-22 ENCOUNTER — Other Ambulatory Visit: Payer: Self-pay | Admitting: Family Medicine

## 2022-05-22 DIAGNOSIS — K219 Gastro-esophageal reflux disease without esophagitis: Secondary | ICD-10-CM

## 2022-05-24 NOTE — Telephone Encounter (Signed)
Rx 03/24/22 #90- too soon Requested Prescriptions  Pending Prescriptions Disp Refills   esomeprazole (NEXIUM) 20 MG capsule [Pharmacy Med Name: ESOMEPRAZOLE MAG DR 20 MG CAP] 90 capsule 1    Sig: TAKE 1 CAPSULE BY MOUTH DAILY BEFORE BREAKFAST.     Gastroenterology: Proton Pump Inhibitors 2 Failed - 05/22/2022 11:33 AM      Failed - ALT in normal range and within 360 days    No results found for: "ALT", "LABALT", "POCALT"       Failed - AST in normal range and within 360 days    No results found for: "POCAST", "AST"       Passed - Valid encounter within last 12 months    Recent Outpatient Visits           4 months ago Type 2 diabetes mellitus with other specified complication, without long-term current use of insulin [facility])   [place] [facility] [place], Netta Neat, DO   5 months ago Morbid obesity [facility])   [place] [facility], Netta Neat, [place]

## 2022-06-01 ENCOUNTER — Other Ambulatory Visit: Payer: Self-pay | Admitting: Family Medicine

## 2022-06-01 DIAGNOSIS — E1169 Type 2 diabetes mellitus with other specified complication: Secondary | ICD-10-CM

## 2022-06-01 NOTE — Telephone Encounter (Signed)
Medication Refill - Medication:  metFORMIN (GLUCOPHAGE-XR) 500 MG 24 hr tablet   Has the patient contacted their pharmacy? No.  Preferred Pharmacy (with phone number or street name): [id number] [place] [place] - [address]  Phone: [phone] Fax: [phone]  Has the patient been seen for an appointment in the last year OR does the patient have an upcoming appointment? Yes.    Agent: Please be advised that RX refills may take up to 3 business days. We ask that you follow-up with your pharmacy.

## 2022-06-02 MED ORDER — METFORMIN HCL ER 500 MG PO TB24
2000.0000 mg | ORAL_TABLET | Freq: Every day | ORAL | 1 refills | Status: DC
Start: 2022-06-02 — End: 2022-10-11

## 2022-06-02 NOTE — Telephone Encounter (Signed)
Requested medication (s) are due for refill today:   Requested medication (s) are on the active medication list: Yes  Last refill:  02/02/22  Future visit scheduled: No  Notes to clinic:  Historical provider.    Requested Prescriptions  Pending Prescriptions Disp Refills   metFORMIN (GLUCOPHAGE-XR) 500 MG 24 hr tablet      Sig: Take 4 tablets (2,000 mg total) by mouth daily.     Endocrinology:  Diabetes - Biguanides Failed - 06/01/2022 10:46 AM      Failed - Cr in normal range and within 360 days    Creatinine, Ser  Date Value Ref Range Status  12/22/2017 0.66 0.44 - 1.00 mg/dL Final         Failed - HBA1C is between 0 and 7.9 and within 180 days    Hemoglobin A1C  Date Value Ref Range Status  12/14/2021 8.9 (A) 4.0 - 5.6 % Final         Failed - eGFR in normal range and within 360 days    GFR calc Af Amer  Date Value Ref Range Status  12/22/2017 >60 >60 mL/min Final   GFR calc non Af Amer  Date Value Ref Range Status  12/22/2017 >60 >60 mL/min Final         Failed - B12 Level in normal range and within 720 days    No results found for: "VITAMINB12"       Failed - CBC within normal limits and completed in the last 12 months    WBC  Date Value Ref Range Status  12/22/2017 6.1 4.0 - 10.5 K/uL Final   RBC  Date Value Ref Range Status  12/22/2017 4.53 3.87 - 5.11 MIL/uL Final   Hemoglobin  Date Value Ref Range Status  12/22/2017 13.1 12.0 - 15.0 g/dL Final   HCT  Date Value Ref Range Status  12/22/2017 40.4 36.0 - 46.0 % Final   MCHC  Date Value Ref Range Status  12/22/2017 32.4 30.0 - 36.0 g/dL Final   MCH  Date Value Ref Range Status  12/22/2017 28.9 26.0 - 34.0 pg Final   MCV  Date Value Ref Range Status  12/22/2017 89.2 80.0 - 100.0 fL Final   No results found for: "PLTCOUNTKUC", "LABPLAT", "POCPLA" RDW  Date Value Ref Range Status  12/22/2017 12.5 11.5 - 15.5 % Final         Passed - Valid encounter within last 6 months    Recent  Outpatient Visits           4 months ago Type 2 diabetes mellitus with other specified complication, without long-term current use of insulin [facility])   [place] [facility] Smitty Cords, DO   5 months ago Morbid obesity [facility])   [place] [facility] Smitty Cords, DO

## 2022-06-16 LAB — HM DIABETES EYE EXAM

## 2022-06-19 ENCOUNTER — Other Ambulatory Visit: Payer: Self-pay | Admitting: Family Medicine

## 2022-06-19 DIAGNOSIS — K219 Gastro-esophageal reflux disease without esophagitis: Secondary | ICD-10-CM

## 2022-06-19 DIAGNOSIS — G4733 Obstructive sleep apnea (adult) (pediatric): Secondary | ICD-10-CM | POA: Diagnosis not present

## 2022-06-21 NOTE — Telephone Encounter (Signed)
Requested Prescriptions  Pending Prescriptions Disp Refills   esomeprazole (NEXIUM) 20 MG capsule [Pharmacy Med Name: ESOMEPRAZOLE MAG DR 20 MG CAP] 90 capsule 0    Sig: TAKE 1 CAPSULE BY MOUTH DAILY BEFORE BREAKFAST.     Gastroenterology: Proton Pump Inhibitors 2 Failed - 06/19/2022 11:32 AM      Failed - ALT in normal range and within 360 days    No results found for: "ALT", "LABALT", "POCALT"       Failed - AST in normal range and within 360 days    No results found for: "POCAST", "AST"       Passed - Valid encounter within last 12 months    Recent Outpatient Visits           5 months ago Type 2 diabetes mellitus with other specified complication, without long-term current use of insulin [facility])   [place] [facility] Smitty Cords, DO   6 months ago Morbid obesity [facility])    [facility] [place], Netta Neat, [place]

## 2022-06-22 DIAGNOSIS — G4733 Obstructive sleep apnea (adult) (pediatric): Secondary | ICD-10-CM | POA: Diagnosis not present

## 2022-06-22 DIAGNOSIS — I10 Essential (primary) hypertension: Secondary | ICD-10-CM | POA: Diagnosis not present

## 2022-07-01 ENCOUNTER — Encounter: Payer: Self-pay | Admitting: Family Medicine

## 2022-07-05 ENCOUNTER — Other Ambulatory Visit: Payer: Self-pay | Admitting: Family Medicine

## 2022-07-06 NOTE — Telephone Encounter (Signed)
Need more information. She used to take [facility] and it was covered. Can you clarify, is the [facility] no longer covered and only Trulicity is preferred? If pharmacy is out of 1.5, she may need to look at other pharmacy and let us know. Or may need to take 0.75 mg dose short term until 1.5 is available. Or we can consider restart Mounjaro,.  She is likely overdue for visit. Last was 01/2022.  Saralyn Pilar, DO [facility] 07/06/2022, 6:15 PM

## 2022-07-06 NOTE — Telephone Encounter (Signed)
Requested medication (s) are due for refill today: yes  Requested medication (s) are on the active medication list: no  Last refill:  05/07/22   Future visit scheduled: no  Notes to clinic:  called pharmacy and per Tresa Endo No 1.5 mg doses available.    Requested Prescriptions  Pending Prescriptions Disp Refills   TRULICITY 1.5 MG/0.5ML SOPN [Pharmacy Med Name: TRULICITY 1.5 MG/0.5 ML PEN]  1    Sig: INJECT 1.5 MG INTO THE SKIN ONCE A WEEK.     Endocrinology:  Diabetes - GLP-1 Receptor Agonists Failed - 07/05/2022 12:54 PM      Failed - HBA1C is between 0 and 7.9 and within 180 days    Hemoglobin A1C  Date Value Ref Range Status  12/14/2021 8.9 (A) 4.0 - 5.6 % Final         Passed - Valid encounter within last 6 months    Recent Outpatient Visits           5 months ago Type 2 diabetes mellitus with other specified complication, without long-term current use of insulin [facility])   [place] [facility] Smitty Cords, DO   6 months ago Morbid obesity [facility])   [place] [facility] [place], Netta Neat, [place]

## 2022-07-07 ENCOUNTER — Other Ambulatory Visit: Payer: Self-pay | Admitting: Family Medicine

## 2022-07-07 DIAGNOSIS — E1169 Type 2 diabetes mellitus with other specified complication: Secondary | ICD-10-CM

## 2022-07-07 NOTE — Telephone Encounter (Signed)
[facility], Pharmacist about the refill(s) Trulicity requested. Pharmacist stated that the do not have 1.5mg  in stock and do not know when it will be available. She also stated that the mounjaro is not covered by patient's insurance.

## 2022-07-07 NOTE — Telephone Encounter (Signed)
Medication Refill - Medication: TRULICITY 0.75 MG/0.5ML SOPN   Has the patient contacted their pharmacy? yes (Agent: If no, request that the patient contact the pharmacy for the refill. If patient does not wish to contact the pharmacy document the reason why and proceed with request.) (Agent: If yes, when and what did the pharmacy advise?)contact pcp  Preferred Pharmacy (with phone number or street name):  [id number] [place] [place] - [address] Phone: [phone]  Fax: [phone]     Has the patient been seen for an appointment in the last year OR does the patient have an upcoming appointment? yes  Agent: Please be advised that RX refills may take up to 3 business days. We ask that you follow-up with your pharmacy.

## 2022-07-07 NOTE — Telephone Encounter (Signed)
Requested medication (s) are due for refill today: Yes  Requested medication (s) are on the active medication list: Yes  Last refill:  05/07/22  Future visit scheduled: No  Notes to clinic:  Please see TE notes      Requested Prescriptions  Pending Prescriptions Disp Refills   TRULICITY 0.75 MG/0.5ML SOPN 2 mL 0    Sig: Inject 0.75 mg into the skin once a week.     Endocrinology:  Diabetes - GLP-1 Receptor Agonists Failed - 07/07/2022  4:14 PM      Failed - HBA1C is between 0 and 7.9 and within 180 days    Hemoglobin A1C  Date Value Ref Range Status  12/14/2021 8.9 (A) 4.0 - 5.6 % Final         Passed - Valid encounter within last 6 months    Recent Outpatient Visits           5 months ago Type 2 diabetes mellitus with other specified complication, without long-term current use of insulin [facility])   [place] [facility] Smitty Cords, DO   6 months ago Morbid obesity [facility])   [facility], Netta Neat, [place]

## 2022-07-09 ENCOUNTER — Other Ambulatory Visit: Payer: Self-pay | Admitting: Family Medicine

## 2022-07-09 DIAGNOSIS — E1169 Type 2 diabetes mellitus with other specified complication: Secondary | ICD-10-CM

## 2022-07-09 MED ORDER — TRULICITY 1.5 MG/0.5ML ~~LOC~~ SOAJ
1.5000 mg | SUBCUTANEOUS | 1 refills | Status: DC
Start: 2022-07-09 — End: 2022-10-11

## 2022-07-09 NOTE — Telephone Encounter (Signed)
Medication Refill - Medication: TRULICITY 0.75 MG/0.5ML SOPN   patient states this should be 1.5 and not 0.75  Has the patient contacted their pharmacy? Yes.     Preferred Pharmacy (with phone number or street name):   [id number] [place] [place] - [address] [place] [address] Phone: [phone]  Fax: [phone]     Has the patient been seen for an appointment in the last year OR does the patient have an upcoming appointment? Yes.    Agent: Please be advised that RX refills may take up to 3 business days. We ask that you follow-up with your pharmacy.

## 2022-07-19 DIAGNOSIS — G4733 Obstructive sleep apnea (adult) (pediatric): Secondary | ICD-10-CM | POA: Diagnosis not present

## 2022-07-22 ENCOUNTER — Telehealth: Payer: Self-pay

## 2022-07-22 DIAGNOSIS — K219 Gastro-esophageal reflux disease without esophagitis: Secondary | ICD-10-CM

## 2022-07-22 MED ORDER — PANTOPRAZOLE SODIUM 20 MG PO TBEC
20.0000 mg | DELAYED_RELEASE_TABLET | Freq: Every day | ORAL | 3 refills | Status: DC
Start: 2022-07-22 — End: 2022-08-18

## 2022-07-22 NOTE — Telephone Encounter (Signed)
Request from pharmacy stating patients insurance only covers 90 esomeprazole in 365 days.  Insurance prefers pantoprazole, lansoprazole or rabeprazole.  Please advise if change is appropriate.

## 2022-08-05 ENCOUNTER — Encounter: Payer: Self-pay | Admitting: Family Medicine

## 2022-08-05 DIAGNOSIS — K219 Gastro-esophageal reflux disease without esophagitis: Secondary | ICD-10-CM

## 2022-08-18 MED ORDER — PANTOPRAZOLE SODIUM 20 MG PO TBEC
20.0000 mg | DELAYED_RELEASE_TABLET | Freq: Every day | ORAL | 11 refills | Status: DC
Start: 2022-08-18 — End: 2022-08-20

## 2022-08-19 NOTE — Telephone Encounter (Signed)
No form or PA received yet for medication requested, still waiting from pharmacy/insurance.

## 2022-08-20 NOTE — Addendum Note (Signed)
Addended by: Smitty Cords on: 08/20/2022 05:43 PM   Modules accepted: Orders

## 2022-10-11 ENCOUNTER — Encounter: Payer: Self-pay | Admitting: Family Medicine

## 2022-10-11 ENCOUNTER — Other Ambulatory Visit: Payer: Self-pay | Admitting: Family Medicine

## 2022-10-11 ENCOUNTER — Ambulatory Visit: Payer: Managed Care, Other (non HMO) | Admitting: Family Medicine

## 2022-10-11 DIAGNOSIS — E1169 Type 2 diabetes mellitus with other specified complication: Secondary | ICD-10-CM

## 2022-10-11 DIAGNOSIS — E785 Hyperlipidemia, unspecified: Secondary | ICD-10-CM

## 2022-10-11 DIAGNOSIS — Z0000 Encounter for general adult medical examination without abnormal findings: Secondary | ICD-10-CM

## 2022-10-11 LAB — POCT GLYCOSYLATED HEMOGLOBIN (HGB A1C): Hemoglobin A1C: 10.4 % — AB (ref 4.0–5.6)

## 2022-10-11 MED ORDER — TRULICITY 3 MG/0.5ML ~~LOC~~ SOAJ
3.0000 mg | SUBCUTANEOUS | 5 refills | Status: DC
Start: 2022-10-11 — End: 2023-01-19

## 2022-10-11 NOTE — Patient Instructions (Addendum)
Thank you for coming to the office today.  Dose increase to Trulicity 3mg  weekly inj  Increase Metformin to 2 in AM and 1 in PM  DUE for FASTING BLOOD WORK (no food or drink after midnight before the lab appointment, only water or coffee without cream/sugar on the morning of)  SCHEDULE "Lab Only" visit in the morning at the clinic for lab draw in 3 MONTHS   - Make sure Lab Only appointment is at about 1 week before your next appointment, so that results will be available  For Lab Results, once available within 2-3 days of blood draw, you can can log in to MyChart online to view your results and a brief explanation. Also, we can discuss results at next follow-up visit.   Please schedule a Follow-up Appointment to: Return in about 3 months (around 01/11/2023) for 3 month fasting lab only then 1 week later Annual Physical (in AM same time as Lebron Conners).  If you have any other questions or concerns, please feel free to call the office or send a message through MyChart. You may also schedule an earlier appointment if necessary.  Additionally, you may be receiving a survey about your experience at our office within a few days to 1 week by e-mail or mail. We value your feedback.  Saralyn Pilar, DO [facility], [place]

## 2022-10-11 NOTE — Progress Notes (Signed)
Subjective:    Patient ID: Hannah Cobb, female    DOB: [birth year], 62 y.o.   MRN: [id number]  Hannah Cobb is a 62 y.o. female presenting on 10/11/2022 for Diabetes (3 month follow up )   HPI   Discussed the use of AI scribe software for clinical note transcription with the patient, who gave verbal consent to proceed.         CHRONIC DM, Type 2: A1c up to 10.4, prior 8 range. Admits not adhered to lifestyle Some wt gain No longer covered on Mounjaro Meds: Trulicity 1.5mg  weekly inj, Metformin XR 500mg  x 2 in AM, interested to adjust dose up to 3 but cannot take 4 in one day. Due to GI intolerance Reports good compliance. Tolerating well w/o side-effects Goal to improve lifestyle Denies hypoglycemia, polyuria, visual changes, numbness or tingling.   Migraines, episodic Improved on Excedrin with good results. Prefers to avoid new meds today Has failed samples of Nurtec and Ubrelvy according to her and she has seen [facility] for this earlier in 2024       10/11/2022    8:48 AM  Depression screen PHQ 2/9  Decreased Interest 0  Down, Depressed, Hopeless 0  PHQ - 2 Score 0  Altered sleeping 0  Tired, decreased energy 0  Change in appetite 0  Feeling bad or failure about yourself  0  Trouble concentrating 0  Moving slowly or fidgety/restless 0  Suicidal thoughts 0  PHQ-9 Score 0  Difficult doing work/chores Not difficult at all    Social History   Tobacco Use   Smoking status: Never   Smokeless tobacco: Never  Vaping Use   Vaping status: Never Used  Substance Use Topics   Alcohol use: Yes    Alcohol/week: 1.0 standard drink of alcohol    Types: 1 Standard drinks or equivalent per week   Drug use: No    Review of Systems Per HPI unless specifically indicated above     Objective:    BP 138/72 (BP Location: Right Wrist, Patient Position: Sitting, Cuff Size: Normal)   Pulse 89   Resp 18   Ht 5\' 8"  (1.727 m)   Wt 265 lb (120.2 kg)    BMI 40.29 kg/m   Wt Readings from Last 3 Encounters:  10/11/22 265 lb (120.2 kg)  02/02/22 262 lb (118.8 kg)  01/15/22 257 lb (116.6 kg)    Physical Exam Vitals and nursing note reviewed.  Constitutional:      General: She is not in acute distress.    Appearance: Normal appearance. She is well-developed. She is obese. She is not diaphoretic.     Comments: Well-appearing, comfortable, cooperative  HENT:     Head: Normocephalic and atraumatic.  Eyes:     General:        Right eye: No discharge.        Left eye: No discharge.     Conjunctiva/sclera: Conjunctivae normal.  Cardiovascular:     Rate and Rhythm: Normal rate.  Pulmonary:     Effort: Pulmonary effort is normal.  Skin:    General: Skin is warm and dry.     Findings: No erythema or rash.  Neurological:     Mental Status: She is alert and oriented to person, place, and time.  Psychiatric:        Mood and Affect: Mood normal.        Behavior: Behavior normal.        Thought  Content: Thought content normal.     Comments: Well groomed, good eye contact, normal speech and thoughts     Diabetic Foot Exam - Simple   Simple Foot Form Diabetic Foot exam was performed with the following findings: Yes 10/11/2022  9:27 AM  Visual Inspection See comments: Yes Sensation Testing Intact to touch and monofilament testing bilaterally: Yes Pulse Check Posterior Tibialis and Dorsalis pulse intact bilaterally: Yes Comments Dry skin bilateral feet, callus, no ulceration. Intact monofilament.      Results for orders placed or performed in visit on 10/11/22  POCT glycosylated hemoglobin (Hb A1C)  Result Value Ref Range   Hemoglobin A1C 10.4 (A) 4.0 - 5.6 %      Assessment & Plan:   Problem List Items Addressed This Visit     Diabetes mellitus (HCC)   Relevant Medications   metFORMIN (GLUCOPHAGE-XR) 500 MG 24 hr tablet   TRULICITY 3 MG/0.5ML SOPN   Other Relevant Orders   POCT glycosylated hemoglobin (Hb A1C) (Completed)    Urine Microalbumin w/creat. ratio   Morbid obesity (HCC) - Primary   Relevant Medications   metFORMIN (GLUCOPHAGE-XR) 500 MG 24 hr tablet   TRULICITY 3 MG/0.5ML SOPN    Assessment and Plan    Type 2 Diabetes Mellitus A1c increased to 10.4 from 8.9 Non adherence to lifestyle and now off Mounjaro due to insurance -Increase Metformin to 500mg  twice in the morning and once in the evening. -Increase Trulicity to 3mg  weekly from 1.5 -Check A1c in 3 months.  Migraine Recurrent headaches managed with Excedrin Migraine. Patient has tried Nurtec and Bernita Raisin in the past with less satisfactory results compared to Excedrin Migraine. -Continue management with Excedrin Migraine as needed.  General Health Maintenance -Order urine test for annual check. -Schedule follow-up appointment in 3 months.        Meds ordered this encounter  Medications   TRULICITY 3 MG/0.5ML SOPN    Sig: Inject 3 mg as directed once a week.    Dispense:  2 mL    Refill:  5     Follow up plan: Return in about 3 months (around 01/11/2023) for 3 month fasting lab only then 1 week later Annual Physical (in AM same time as Lebron Conners).  Future labs ordered for 01/06/23   Saralyn Pilar, DO [facility] 10/11/2022, 9:17 AM

## 2022-10-12 LAB — MICROALBUMIN / CREATININE URINE RATIO
Creatinine, Urine: 187 mg/dL (ref 20–275)
Microalb Creat Ratio: 31 mg/g{creat} — ABNORMAL HIGH (ref <30)
Microalb, Ur: 5.8 mg/dL

## 2022-10-20 DIAGNOSIS — M9903 Segmental and somatic dysfunction of lumbar region: Secondary | ICD-10-CM | POA: Diagnosis not present

## 2022-10-20 DIAGNOSIS — M9901 Segmental and somatic dysfunction of cervical region: Secondary | ICD-10-CM | POA: Diagnosis not present

## 2022-10-20 DIAGNOSIS — M545 Low back pain, unspecified: Secondary | ICD-10-CM | POA: Diagnosis not present

## 2022-10-20 DIAGNOSIS — M542 Cervicalgia: Secondary | ICD-10-CM | POA: Diagnosis not present

## 2022-10-22 DIAGNOSIS — M9903 Segmental and somatic dysfunction of lumbar region: Secondary | ICD-10-CM | POA: Diagnosis not present

## 2022-10-22 DIAGNOSIS — M545 Low back pain, unspecified: Secondary | ICD-10-CM | POA: Diagnosis not present

## 2022-10-22 DIAGNOSIS — M9901 Segmental and somatic dysfunction of cervical region: Secondary | ICD-10-CM | POA: Diagnosis not present

## 2022-10-22 DIAGNOSIS — M542 Cervicalgia: Secondary | ICD-10-CM | POA: Diagnosis not present

## 2022-10-25 DIAGNOSIS — M9901 Segmental and somatic dysfunction of cervical region: Secondary | ICD-10-CM | POA: Diagnosis not present

## 2022-10-25 DIAGNOSIS — M542 Cervicalgia: Secondary | ICD-10-CM | POA: Diagnosis not present

## 2022-10-25 DIAGNOSIS — M9903 Segmental and somatic dysfunction of lumbar region: Secondary | ICD-10-CM | POA: Diagnosis not present

## 2022-10-25 DIAGNOSIS — M545 Low back pain, unspecified: Secondary | ICD-10-CM | POA: Diagnosis not present

## 2022-11-04 ENCOUNTER — Ambulatory Visit: Payer: Self-pay

## 2022-11-04 NOTE — Telephone Encounter (Signed)
Chief Complaint: Insect bite  Symptoms: itching, redness, mild swelling Frequency: constant  Pertinent Negatives: Patient denies fever, pus, pain  Disposition: [] ED /[] Urgent Care (no appt availability in office) / [] Appointment(In office/virtual)/ []  [place] [facility]/ [x] Home Care/ [] Refused Recommended Disposition /[] [place] [facility]/ []  Follow-up with PCP Additional Notes: Patient states she felt like she was bite by an insect on her left forearm Tuesday night but she did not see what it was. Patient reports it is very itchy and has used hydrocortisone cream on it. Patient denies pain at the site. Patient stated she is out of town and wanted to know what she can do to make it feel better because the cream only decreased the itching a little. Care advice was given and patient was advised to callback if symptoms do not improve over the next 24 hours. Patient verbalized understanding.  Summary: left forearm   Insect bite. Left forearm warm to the touch, itching ..three days     Reason for Disposition  Itchy insect bite  Answer Assessment - Initial Assessment Questions 1. TYPE of INSECT: "What type of insect was it?"      I don't know  2. ONSET: "When did you get bitten?"      Tuesday night 3. LOCATION: "Where is the insect bite located?"      Left forearm  4. REDNESS: "Is the area red or pink?" If Yes, ask: "What size is area of redness?" (inches or cm). "When did the redness start?"     Yes, 3 inches  5. PAIN: "Is there any pain?" If Yes, ask: "How bad is it?"  (Scale 1-10; or mild, moderate, severe)     Mild  6. ITCHING: "Does it itch?" If Yes, ask: "How bad is the itch?"    - MILD: doesn't interfere with normal activities   - MODERATE-SEVERE: interferes with work, school, sleep, or other activities      Mild to Moderate  7. SWELLING: "How big is the swelling?" (inches, cm, or compare to coins)     Mild swelling 8. OTHER SYMPTOMS: "Do you have any other symptoms?"   (e.g., difficulty breathing, hives)     No  Protocols used: Insect Bite-A-AH

## 2022-11-05 ENCOUNTER — Telehealth: Payer: Self-pay | Admitting: Pharmacist

## 2022-11-05 DIAGNOSIS — E1169 Type 2 diabetes mellitus with other specified complication: Secondary | ICD-10-CM

## 2022-11-05 MED ORDER — BLOOD GLUCOSE MONITORING SUPPL DEVI: 0 refills | Status: DC

## 2022-11-05 MED ORDER — BLOOD GLUCOSE TEST VI STRP: ORAL_STRIP | 3 refills | Status: DC

## 2022-11-05 MED ORDER — ACCU-CHEK SOFTCLIX LANCETS MISC: 12 refills | Status: DC

## 2022-11-05 NOTE — Addendum Note (Signed)
Addended by: Judd Gaudier on: 11/05/2022 02:21 PM   Modules accepted: Orders

## 2022-11-05 NOTE — Telephone Encounter (Signed)
Glucose test supplies sent to pharmacy.

## 2022-11-05 NOTE — Progress Notes (Signed)
Per patient's spouse, patient requesting prescription for glucometer and testing supplies to monitor her blood sugar at home.  Would you please consider sending these to [facility] for her?  Thank you!  Gentry Fitz

## 2022-11-18 ENCOUNTER — Other Ambulatory Visit: Payer: Self-pay | Admitting: Family Medicine

## 2022-11-18 DIAGNOSIS — K219 Gastro-esophageal reflux disease without esophagitis: Secondary | ICD-10-CM

## 2022-11-18 NOTE — Telephone Encounter (Signed)
Requested medication (s) are due for refill today:   Not sure  Requested medication (s) are on the active medication list:   Historical  Future visit scheduled:   Yes 01/13/2023   Last ordered: Historical  It looks like back in Aug. It may have been ordered  Unable to refill due to labs overdue and it being historical   Requested Prescriptions  Pending Prescriptions Disp Refills   esomeprazole (NEXIUM) 20 MG capsule [Pharmacy Med Name: ESOMEPRAZOLE MAG DR 20 MG CAP] 90 capsule     Sig: TAKE 1 CAPSULE BY MOUTH EVERY DAY BEFORE BREAKFAST     Gastroenterology: Proton Pump Inhibitors 2 Failed - 11/18/2022  1:34 AM      Failed - ALT in normal range and within 360 days    No results found for: "ALT", "LABALT", "POCALT"       Failed - AST in normal range and within 360 days    No results found for: "POCAST", "AST"       Passed - Valid encounter within last 12 months    Recent Outpatient Visits           1 month ago Morbid obesity [facility])   [place] [facility], Netta Neat, DO   10 months ago Type 2 diabetes mellitus with other specified complication, without long-term current use of insulin [facility])   [facility] Smitty Cords, DO   11 months ago Morbid obesity [facility])   [place], Netta Neat, DO       Future Appointments             In 1 month Althea Charon, Netta Neat, DO  [facility], [facility]

## 2022-12-04 ENCOUNTER — Other Ambulatory Visit: Payer: Self-pay | Admitting: Family Medicine

## 2022-12-04 DIAGNOSIS — E1169 Type 2 diabetes mellitus with other specified complication: Secondary | ICD-10-CM

## 2022-12-07 MED ORDER — METFORMIN HCL ER 500 MG PO TB24: ORAL_TABLET | ORAL | 3 refills | Status: DC

## 2022-12-07 NOTE — Telephone Encounter (Signed)
Requested medication (s) are due for refill today: -  Requested medication (s) are on the active medication list: historical med  Last refill:  10/11/22  Future visit scheduled: yes  Notes to clinic:  no date amount prescribed or any refills   Requested Prescriptions  Pending Prescriptions Disp Refills   metFORMIN (GLUCOPHAGE-XR) 500 MG 24 hr tablet [Pharmacy Med Name: METFORMIN HCL ER 500 MG TABLET] 120 tablet 5    Sig: TAKE 4 TABLETS (2,000 MG TOTAL) BY MOUTH DAILY     Endocrinology:  Diabetes - Biguanides Failed - 12/04/2022  8:32 AM      Failed - Cr in normal range and within 360 days    Creatinine, Ser  Date Value Ref Range Status  12/22/2017 0.66 0.44 - 1.00 mg/dL Final   Creatinine, Urine  Date Value Ref Range Status  10/11/2022 187 20 - 275 mg/dL Final         Failed - HBA1C is between 0 and 7.9 and within 180 days    Hemoglobin A1C  Date Value Ref Range Status  10/11/2022 10.4 (A) 4.0 - 5.6 % Final         Failed - eGFR in normal range and within 360 days    GFR calc Af Amer  Date Value Ref Range Status  12/22/2017 >60 >60 mL/min Final   GFR calc non Af Amer  Date Value Ref Range Status  12/22/2017 >60 >60 mL/min Final         Failed - B12 Level in normal range and within 720 days    No results found for: "VITAMINB12"       Failed - CBC within normal limits and completed in the last 12 months    WBC  Date Value Ref Range Status  12/22/2017 6.1 4.0 - 10.5 K/uL Final   RBC  Date Value Ref Range Status  12/22/2017 4.53 3.87 - 5.11 MIL/uL Final   Hemoglobin  Date Value Ref Range Status  12/22/2017 13.1 12.0 - 15.0 g/dL Final   HCT  Date Value Ref Range Status  12/22/2017 40.4 36.0 - 46.0 % Final   MCHC  Date Value Ref Range Status  12/22/2017 32.4 30.0 - 36.0 g/dL Final   [facility]  Date Value Ref Range Status  12/22/2017 28.9 26.0 - 34.0 pg Final   MCV  Date Value Ref Range Status  12/22/2017 89.2 80.0 - 100.0 fL Final   No results found for:  "PLTCOUNTKUC", "LABPLAT", "POCPLA" RDW  Date Value Ref Range Status  12/22/2017 12.5 11.5 - 15.5 % Final         Passed - Valid encounter within last 6 months    Recent Outpatient Visits           1 month ago Morbid obesity [facility])   [place] [facility] Smitty Cords, DO   10 months ago Type 2 diabetes mellitus with other specified complication, without long-term current use of insulin [facility])   [facility] Smitty Cords, DO   11 months ago Morbid obesity [facility])   [place] [facility] Smitty Cords, DO       Future Appointments             In 1 month Althea Charon, Netta Neat, DO [facility], [facility]

## 2022-12-15 ENCOUNTER — Encounter: Payer: Self-pay | Admitting: Family Medicine

## 2022-12-15 ENCOUNTER — Ambulatory Visit: Payer: Self-pay | Admitting: Family Medicine

## 2022-12-15 VITALS — BP 136/65 | HR 71 | Ht 68.0 in | Wt 262.0 lb

## 2022-12-15 DIAGNOSIS — R222 Localized swelling, mass and lump, trunk: Secondary | ICD-10-CM

## 2022-12-15 NOTE — Patient Instructions (Addendum)
Thank you for coming to the office today.  Subcutaneous nodule of abdominal wall seems to be a localized nodular density. It is reassuring overall. I don't feel or hear any high risk features. I would suggest monitoring it for next several months or longer to determine if changing in size or symptoms.  Next step is Ultrasound of the area to evaluate it and determine if there is a concern.  Let me know if want to pursue ultrasound.  Please schedule a Follow-up Appointment to: Return if symptoms worsen or fail to improve.  If you have any other questions or concerns, please feel free to call the office or send a message through MyChart. You may also schedule an earlier appointment if necessary.  Additionally, you may be receiving a survey about your experience at our office within a few days to 1 week by e-mail or mail. We value your feedback.  Saralyn Pilar, DO [facility], [place]

## 2022-12-15 NOTE — Progress Notes (Signed)
Subjective:    Patient ID: Hannah Cobb, female    DOB: Jan 25, 1960, 62 y.o.   MRN: [id number]  Hannah Cobb is a 62 y.o. female presenting on 12/15/2022 for naval (Knot present)  Patient presents for a same day appointment.  HPI  Discussed the use of AI scribe software for clinical note transcription with the patient, who gave verbal consent to proceed.  Subcutaneous Nodule / Abdomen  She presented with a newly discovered abdominal wall nodule or mass. She noticed the mass incidentally while scratching an itch on her abdomen. The mass, described as a 'knot,' was located deep in the tissue, to the left of and slightly below the belly button. The patient reported no associated pain or discomfort, and there were no other symptoms mentioned. The mass was not tender to touch and did not appear to cause any functional impairment. The patient expressed concern about the nature of the mass and sought medical consultation for further evaluation.     No change in bowel habits or other digestive symptoms.      12/15/2022    4:18 PM 10/11/2022    8:48 AM  Depression screen PHQ 2/9  Decreased Interest 0 0  Down, Depressed, Hopeless 0 0  PHQ - 2 Score 0 0  Altered sleeping  0  Tired, decreased energy  0  Change in appetite  0  Feeling bad or failure about yourself   0  Trouble concentrating  0  Moving slowly or fidgety/restless  0  Suicidal thoughts  0  PHQ-9 Score  0  Difficult doing work/chores  Not difficult at all       12/15/2022    4:18 PM 10/11/2022    8:48 AM  GAD 7 : Generalized Anxiety Score  Nervous, Anxious, on Edge 0 0  Control/stop worrying 0 0  Worry too much - different things 0 0  Trouble relaxing 0 0  Restless 0 0  Easily annoyed or irritable 0 0  Afraid - awful might happen 0 0  Total GAD 7 Score 0 0  Anxiety Difficulty  Not difficult at all    Social History   Tobacco Use   Smoking status: Never   Smokeless tobacco: Never  Vaping Use   Vaping  status: Never Used  Substance Use Topics   Alcohol use: Yes    Alcohol/week: 1.0 standard drink of alcohol    Types: 1 Standard drinks or equivalent per week   Drug use: No    Review of Systems Per HPI unless specifically indicated above     Objective:    BP 136/65   Pulse 71   Ht 5\' 8"  (1.727 m)   Wt 262 lb (118.8 kg)   BMI 39.84 kg/m   Wt Readings from Last 3 Encounters:  12/15/22 262 lb (118.8 kg)  10/11/22 265 lb (120.2 kg)  02/02/22 262 lb (118.8 kg)    Physical Exam Vitals and nursing note reviewed.  Constitutional:      General: She is not in acute distress.    Appearance: Normal appearance. She is well-developed. She is obese. She is not diaphoretic.     Comments: Well-appearing, comfortable, cooperative  HENT:     Head: Normocephalic and atraumatic.  Eyes:     General:        Right eye: No discharge.        Left eye: No discharge.     Conjunctiva/sclera: Conjunctivae normal.  Cardiovascular:     Rate and  Rhythm: Normal rate.  Pulmonary:     Effort: Pulmonary effort is normal.  Abdominal:     General: There is no distension.     Palpations: There is mass (nodular subcutaneous nodular mass approx 2 cm size or smaller. approx 1 inch left of umbilicus and 1 inch inferior).     Tenderness: There is no abdominal tenderness.     Hernia: No hernia is present.  Skin:    General: Skin is warm and dry.     Findings: No erythema or rash.  Neurological:     Mental Status: She is alert and oriented to person, place, and time.  Psychiatric:        Mood and Affect: Mood normal.        Behavior: Behavior normal.        Thought Content: Thought content normal.     Comments: Well groomed, good eye contact, normal speech and thoughts     Results for orders placed or performed in visit on 10/11/22  Urine Microalbumin w/creat. ratio  Result Value Ref Range   Creatinine, Urine 187 20 - 275 mg/dL   Microalb, Ur 5.8 mg/dL   Microalb Creat Ratio 31 (H) <30 mg/g creat   POCT glycosylated hemoglobin (Hb A1C)  Result Value Ref Range   Hemoglobin A1C 10.4 (A) 4.0 - 5.6 %      Assessment & Plan:   Problem List Items Addressed This Visit   None Visit Diagnoses     Subcutaneous nodule of abdominal wall    -  Primary        Assessment and Plan    Subcutaneous Nodule of Abdomen Newly discovered, non-tender, firm, smooth, nodular mass in the left lower quadrant of the abdomen. Not consistent with hernia, infection, or lymph node. Possible lipoma or normal nodularity of tissue.  -Monitor for changes in size or symptoms. -Consider ultrasound imaging if growth or symptoms occur, or if patient desires further investigation. -Check blood counts at next visit to rule out systemic issues.         No orders of the defined types were placed in this encounter.   No orders of the defined types were placed in this encounter.   Follow up plan: Return if symptoms worsen or fail to improve.    Saralyn Pilar, DO [facility] 12/15/2022, 4:28 PM

## 2023-01-06 ENCOUNTER — Other Ambulatory Visit: Payer: 59

## 2023-01-06 DIAGNOSIS — E1169 Type 2 diabetes mellitus with other specified complication: Secondary | ICD-10-CM

## 2023-01-06 DIAGNOSIS — Z0000 Encounter for general adult medical examination without abnormal findings: Secondary | ICD-10-CM

## 2023-01-07 DIAGNOSIS — E1169 Type 2 diabetes mellitus with other specified complication: Secondary | ICD-10-CM | POA: Diagnosis not present

## 2023-01-07 DIAGNOSIS — Z0000 Encounter for general adult medical examination without abnormal findings: Secondary | ICD-10-CM | POA: Diagnosis not present

## 2023-01-07 DIAGNOSIS — E785 Hyperlipidemia, unspecified: Secondary | ICD-10-CM | POA: Diagnosis not present

## 2023-01-08 LAB — COMPLETE METABOLIC PANEL WITH GFR
AG Ratio: 2.1 (calc) (ref 1.0–2.5)
ALT: 31 U/L — ABNORMAL HIGH (ref 6–29)
AST: 17 U/L (ref 10–35)
Albumin: 4.1 g/dL (ref 3.6–5.1)
Alkaline phosphatase (APISO): 100 U/L (ref 37–153)
BUN: 15 mg/dL (ref 7–25)
CO2: 27 mmol/L (ref 20–32)
Calcium: 9.2 mg/dL (ref 8.6–10.4)
Chloride: 97 mmol/L — ABNORMAL LOW (ref 98–110)
Creat: 0.76 mg/dL (ref 0.50–1.05)
Globulin: 2 g/dL (ref 1.9–3.7)
Glucose, Bld: 304 mg/dL — ABNORMAL HIGH (ref 65–99)
Potassium: 4.6 mmol/L (ref 3.5–5.3)
Sodium: 134 mmol/L — ABNORMAL LOW (ref 135–146)
Total Bilirubin: 0.4 mg/dL (ref 0.2–1.2)
Total Protein: 6.1 g/dL (ref 6.1–8.1)
eGFR: 87 mL/min/{1.73_m2} (ref >=60)

## 2023-01-08 LAB — CBC WITH DIFFERENTIAL/PLATELET
Absolute Lymphocytes: 1820 {cells}/uL (ref 850–3900)
Absolute Monocytes: 377 {cells}/uL (ref 200–950)
Basophils Absolute: 78 {cells}/uL (ref 0–200)
Basophils Relative: 1.2 %
Eosinophils Absolute: 111 {cells}/uL (ref 15–500)
Eosinophils Relative: 1.7 %
HCT: 38 % (ref 35.0–45.0)
Hemoglobin: 12.3 g/dL (ref 11.7–15.5)
MCH: 28.7 pg (ref 27.0–33.0)
MCHC: 32.4 g/dL (ref 32.0–36.0)
MCV: 88.8 fL (ref 80.0–100.0)
MPV: 10.3 fL (ref 7.5–12.5)
Monocytes Relative: 5.8 %
Neutro Abs: 4115 {cells}/uL (ref 1500–7800)
Neutrophils Relative %: 63.3 %
Platelets: 264 10*3/uL (ref 140–400)
RBC: 4.28 10*6/uL (ref 3.80–5.10)
RDW: 11.8 % (ref 11.0–15.0)
Total Lymphocyte: 28 %
WBC: 6.5 10*3/uL (ref 3.8–10.8)

## 2023-01-08 LAB — LIPID PANEL
Cholesterol: 171 mg/dL (ref <200)
HDL: 40 mg/dL — ABNORMAL LOW (ref >=50)
LDL Cholesterol (Calc): 99 mg/dL
Non-HDL Cholesterol (Calc): 131 mg/dL — ABNORMAL HIGH (ref <130)
Total CHOL/HDL Ratio: 4.3 (calc) (ref <5.0)
Triglycerides: 205 mg/dL — ABNORMAL HIGH (ref <150)

## 2023-01-08 LAB — TSH: TSH: 1.79 m[IU]/L (ref 0.40–4.50)

## 2023-01-08 LAB — HEMOGLOBIN A1C
Hgb A1c MFr Bld: 10.5 %{Hb} — ABNORMAL HIGH (ref <5.7)
Mean Plasma Glucose: 255 mg/dL
eAG (mmol/L): 14.1 mmol/L

## 2023-01-11 DIAGNOSIS — Z79899 Other long term (current) drug therapy: Secondary | ICD-10-CM | POA: Diagnosis not present

## 2023-01-11 DIAGNOSIS — L400 Psoriasis vulgaris: Secondary | ICD-10-CM | POA: Diagnosis not present

## 2023-01-13 ENCOUNTER — Ambulatory Visit (INDEPENDENT_AMBULATORY_CARE_PROVIDER_SITE_OTHER): Payer: Self-pay | Admitting: Family Medicine

## 2023-01-13 ENCOUNTER — Encounter: Payer: Self-pay | Admitting: Family Medicine

## 2023-01-13 VITALS — BP 140/82 | HR 82 | Ht 68.0 in | Wt 268.0 lb

## 2023-01-13 DIAGNOSIS — Z111 Encounter for screening for respiratory tuberculosis: Secondary | ICD-10-CM

## 2023-01-13 DIAGNOSIS — E1169 Type 2 diabetes mellitus with other specified complication: Secondary | ICD-10-CM

## 2023-01-13 DIAGNOSIS — M25561 Pain in right knee: Secondary | ICD-10-CM | POA: Diagnosis not present

## 2023-01-13 DIAGNOSIS — E785 Hyperlipidemia, unspecified: Secondary | ICD-10-CM

## 2023-01-13 DIAGNOSIS — G8929 Other chronic pain: Secondary | ICD-10-CM | POA: Diagnosis not present

## 2023-01-13 DIAGNOSIS — Z79899 Other long term (current) drug therapy: Secondary | ICD-10-CM

## 2023-01-13 DIAGNOSIS — Z0000 Encounter for general adult medical examination without abnormal findings: Secondary | ICD-10-CM | POA: Diagnosis not present

## 2023-01-13 DIAGNOSIS — L400 Psoriasis vulgaris: Secondary | ICD-10-CM | POA: Diagnosis not present

## 2023-01-13 NOTE — Progress Notes (Signed)
 Subjective:    Patient ID: Hannah Cobb, female    DOB: Feb 27, 1960, 63 y.o.   MRN: [id number]  Hannah Cobb is a 63 y.o. female presenting on 01/13/2023 for Annual Exam   HPI  Discussed the use of AI scribe software for clinical note transcription with the patient, who gave verbal consent to proceed.  History of Present Illness    The patient, with a history of diabetes, presented for an annual physical.   CHRONIC DM, Type 2: A1c from 10.4 to 10.5 She is trying to improve diet, but limited results No longer covered on Mounjaro  Meds: Trulicity  1.5mg  weekly inj, Metformin  XR 500mg  x 2 in AM + 1 in PM Reports good compliance. Tolerating well w/o side-effects Goal to improve lifestyle Denies hypoglycemia, polyuria, visual changes, numbness or tingling.   Migraines, episodic   The patient's lab results were reviewed, with minor abnormalities noted in liver enzymes and sodium levels, which were attributed to her elevated blood glucose levels. Her kidney function, blood counts, and thyroid function were all reported as normal.   Additional topics today  Psoriasis Followed by Dermatology The patient mentioned a need to switch her psoriasis medication, which required a TB test. The patient was unsure whether a blood test or skin test was required and planned to confirm with her dermatologist.  Right Knee Pain / Instability The patient also reported ongoing issues with her right knee. She described difficulty walking upon standing, which improved as she moved around. She denied any previous imaging or treatment for this issue. The patient expressed a willingness to undergo imaging to investigate the cause of her knee discomfort.   Health Maintenance:  She will follow up with GYN for Upcoming Pap Smear + Mammo  Colon CA Screening. Last done 2019 [facility]. Negative repeat 10 yr. I advised repeat Cologuard at year 7. Next due in 2026.     01/13/2023    8:47 AM 12/15/2022    4:18  PM 10/11/2022    8:48 AM  Depression screen PHQ 2/9  Decreased Interest 0 0 0  Down, Depressed, Hopeless 0 0 0  PHQ - 2 Score 0 0 0  Altered sleeping   0  Tired, decreased energy   0  Change in appetite   0  Feeling bad or failure about yourself    0  Trouble concentrating   0  Moving slowly or fidgety/restless   0  Suicidal thoughts   0  PHQ-9 Score   0  Difficult doing work/chores   Not difficult at all       01/13/2023    8:47 AM 12/15/2022    4:18 PM 10/11/2022    8:48 AM  GAD 7 : Generalized Anxiety Score  Nervous, Anxious, on Edge 0 0 0  Control/stop worrying 0 0 0  Worry too much - different things 0 0 0  Trouble relaxing 0 0 0  Restless 0 0 0  Easily annoyed or irritable 0 0 0  Afraid - awful might happen 0 0 0  Total GAD 7 Score 0 0 0  Anxiety Difficulty   Not difficult at all     Past Medical History:  Diagnosis Date   Allergic rhinitis    Diabetes mellitus without complication (HCC)    GERD (gastroesophageal reflux disease)    Psoriasis    Sleep apnea    Past Surgical History:  Procedure Laterality Date   COLONOSCOPY  2009   diverticulosis   ORIF  ANKLE FRACTURE Left Dec    jan 1991-removal of hardware   SEPTOPLASTY  1985   deviated septum   TONSILLECTOMY AND ADENOIDECTOMY  1978   Social History   Socioeconomic History   Marital status: Married    Spouse name: Belvie since 2004   Number of children: Not on file   Years of education: Not on file   Highest education level: Not on file  Occupational History   Occupation: Lab Tech  Tobacco Use   Smoking status: Never   Smokeless tobacco: Never  Vaping Use   Vaping status: Never Used  Substance and Sexual Activity   Alcohol use: Yes    Alcohol/week: 1.0 standard drink of alcohol    Types: 1 Standard drinks or equivalent per week   Drug use: No   Sexual activity: Yes    Partners: Male    Birth control/protection: Post-menopausal  Other Topics Concern   Not on file  Social History  Narrative   Not on file   Social Drivers of Health   Financial Resource Strain: Low Risk  (12/15/2022)   Overall Financial Resource Strain (CARDIA)    Difficulty of Paying Living Expenses: Not hard at all  Food Insecurity: No Food Insecurity (12/15/2022)   Hunger Vital Sign    Worried About Running Out of Food in the Last Year: Never true    Ran Out of Food in the Last Year: Never true  Transportation Needs: No Transportation Needs (12/15/2022)   PRAPARE - Administrator, Civil Service (Medical): No    Lack of Transportation (Non-Medical): No  Physical Activity: Insufficiently Active (12/15/2022)   Exercise Vital Sign    Days of Exercise per Week: 4 days    Minutes of Exercise per Session: 30 min  Stress: No Stress Concern Present (12/15/2022)   [employer] of Occupational Health - Occupational Stress Questionnaire    Feeling of Stress : Not at all  Social Connections: Socially Integrated (12/15/2022)   Social Connection and Isolation Panel [NHANES]    Frequency of Communication with Friends and Family: More than three times a week    Frequency of Social Gatherings with Friends and Family: More than three times a week    Attends Religious Services: More than 4 times per year    Active Member of [employer] or Organizations: No    Attends Engineer, Structural: 1 to 4 times per year    Marital Status: Married  Catering Manager Violence: Not At Risk (12/15/2022)   Humiliation, Afraid, Rape, and Kick questionnaire    Fear of Current or Ex-Partner: No    Emotionally Abused: No    Physically Abused: No    Sexually Abused: No   Family History  Problem Relation Age of Onset   Cirrhosis Mother        nonalcoholic   Diabetes Mother    Heart disease Mother    Hypertension Mother    Osteoporosis Mother    Neuropathy Mother    Hypertension Father    Hypertension Maternal Grandmother    Heart disease Maternal Grandmother    Diabetes Maternal Grandfather     Hypertension Maternal Grandfather    Heart disease Maternal Grandfather    Diabetes Paternal Grandmother    Lymphoma Brother 56   Breast cancer Neg Hx    Current Outpatient Medications on File Prior to Visit  Medication Sig   ascorbic acid (VITAMIN C) 500 MG tablet Take 1,000 mg by mouth daily.   Cholecalciferol (  VITAMIN D3) 10 MCG (400 UNIT) tablet Take 400 Units by mouth daily.   clorazepate  (TRANXENE ) 7.5 MG tablet Take 1-2 tablets (7.5-15 mg total) by mouth 2 (two) times daily as needed for anxiety.   docusate sodium (COLACE) 100 MG capsule Take 100 mg by mouth daily.   esomeprazole  (NEXIUM ) 20 MG capsule TAKE 1 CAPSULE BY MOUTH EVERY DAY BEFORE BREAKFAST   gabapentin  (NEURONTIN ) 300 MG capsule Take 300 mg by mouth daily.   metFORMIN  (GLUCOPHAGE -XR) 500 MG 24 hr tablet Take 2 tablets in morning with meal and take 1 tablet in evening with meal.   Multiple Vitamins-Minerals (CENTRUM WOMEN PO) Take by mouth.   TREMFYA 100 MG/ML SOPN Inject 100 mg as directed.   TRULICITY  3 MG/0.5ML SOPN Inject 3 mg as directed once a week.   clobetasol (TEMOVATE) 0.05 % external solution Apply topically. (Patient not taking: Reported on 01/13/2023)   No current facility-administered medications on file prior to visit.    Review of Systems Per HPI unless specifically indicated above     Objective:    BP (!) 140/82   Pulse 82   Ht 5' 8 (1.727 m)   Wt 268 lb (121.6 kg)   SpO2 97%   BMI 40.75 kg/m   Wt Readings from Last 3 Encounters:  01/13/23 268 lb (121.6 kg)  12/15/22 262 lb (118.8 kg)  10/11/22 265 lb (120.2 kg)    Physical Exam Vitals and nursing note reviewed.  Constitutional:      General: She is not in acute distress.    Appearance: She is well-developed. She is obese. She is not diaphoretic.     Comments: Well-appearing, comfortable, cooperative  HENT:     Head: Normocephalic and atraumatic.  Eyes:     General:        Right eye: No discharge.        Left eye: No discharge.      Conjunctiva/sclera: Conjunctivae normal.     Pupils: Pupils are equal, round, and reactive to light.  Neck:     Thyroid: No thyromegaly.  Cardiovascular:     Rate and Rhythm: Normal rate and regular rhythm.     Pulses: Normal pulses.     Heart sounds: Normal heart sounds. No murmur heard. Pulmonary:     Effort: Pulmonary effort is normal. No respiratory distress.     Breath sounds: Normal breath sounds. No wheezing or rales.  Abdominal:     General: Bowel sounds are normal. There is no distension.     Palpations: Abdomen is soft. There is no mass.     Tenderness: There is no abdominal tenderness.  Musculoskeletal:        General: No tenderness. Normal range of motion.     Cervical back: Normal range of motion and neck supple.     Right lower leg: No edema.     Left lower leg: No edema.     Comments: Upper / Lower Extremities: - Normal muscle tone, strength bilateral upper extremities 5/5, lower extremities 5/5  Lymphadenopathy:     Cervical: No cervical adenopathy.  Skin:    General: Skin is warm and dry.     Findings: No erythema or rash.  Neurological:     Mental Status: She is alert and oriented to person, place, and time.     Comments: Distal sensation intact to light touch all extremities  Psychiatric:        Mood and Affect: Mood normal.  Behavior: Behavior normal.        Thought Content: Thought content normal.     Comments: Well groomed, good eye contact, normal speech and thoughts     Results for orders placed or performed in visit on 01/13/23  HM COLONOSCOPY   Collection Time: 07/04/17 12:00 AM  Result Value Ref Range   HM Colonoscopy See Report (in chart) See Report (in chart), Patient Reported      Assessment & Plan:   Problem List Items Addressed This Visit     Diabetes mellitus (HCC)   Relevant Orders   CT CARDIAC SCORING (SELF PAY ONLY)   Morbid obesity (HCC)   Other Visit Diagnoses       Annual physical exam    -  Primary      Hyperlipidemia associated with type 2 diabetes mellitus (HCC)       Relevant Orders   CT CARDIAC SCORING (SELF PAY ONLY)     High risk medication use       Relevant Orders   QuantiFERON-TB Gold Plus     Screening-pulmonary TB       Relevant Orders   QuantiFERON-TB Gold Plus     Chronic pain of right knee       Relevant Orders   DG Knee Complete 4 Views Right        Updated Health Maintenance information Reviewed recent lab results with patient Encouraged improvement to lifestyle with diet and exercise Goal of weight loss   Psoriasis Patient is required to get a TB test before switching psoriasis medication. Discussed the options of a blood test or skin test for TB. -Order TB blood test. Patient to decide on whether to proceed with blood test or skin test and communicate decision via message. Note PPD TB Skin test required to be performed Mon-Weds, we cannot admin today due to cannot return 48-72 hours for reading.  Type 2 Diabetes, uncontrolled Hyperglycemia complication A1c of 10.5, indicating poor glycemic control despite current regimen of Metformin  and Trulicity . Discussed the need for improved diet and potential for medication upgrade when insurance allows. -Continue current regimen of Metformin  and Trulicity . -Ideally we would be able to switch back to Mounjaro , she had success. But it is not covered now. Maybe when Medicare is active -Provide patient with dietary information to aid in glycemic control. -Consider medication upgrade when insurance allows.  Hyperlipidemia LDL cholesterol of 99, just under the target of 100. No major changes to cholesterol levels. -Continue current management.  Right Knee Pain, Chronic Patient reports difficulty walking and limping due to right knee pain. No prior imaging. -Order X-ray of right knee.  -Advise patient to use muscle rub, Voltaren, and consider a compression wrap or brace for support.  General Health Maintenance -Upcoming  Pap smear and mammogram to be scheduled by OBGYN. -Ordered Coronary Artery Calcium heart CT scan. -Schedule follow-up appointment in 7-8 months, after patient's trip and transition to Medicare.         Orders Placed This Encounter  Procedures   CT CARDIAC SCORING (SELF PAY ONLY)    Standing Status:   Future    Expiration Date:   01/13/2024    Preferred imaging location?:   [facility]   DG Knee Complete 4 Views Right    Standing Status:   Future    Expiration Date:   01/13/2024    Reason for Exam (SYMPTOM  OR DIAGNOSIS REQUIRED):   right knee pain, chronic    Preferred imaging location?:  [facility]   QuantiFERON-TB Gold Plus    Standing Status:   Future    Number of Occurrences:   1    Expiration Date:   01/13/2024   HM COLONOSCOPY    This external order was created through the Results Console.    No orders of the defined types were placed in this encounter.    Follow up plan: Return in about 7 months (around 08/13/2023) for 7-8 months August DM A1c, after on Medicare.  Marsa Officer, DO [facility] 01/13/2023, 9:07 AM

## 2023-01-13 NOTE — Patient Instructions (Addendum)
 Thank you for coming to the office today.  Walk in for X-ray Right knee anytime, Mon-Thurs, 8am-4pm.   You have been referred for a Coronary Calcium Score Cardiac CT Scan. This is a screening test for patients aged 65-50+ with cardiovascular risk factors or who are healthy but would be interested in Cardiovascular Screening for heart disease. Even if there is a family history of heart disease, this imaging can be useful. Typically it can be done every 5+ years or at a different timeline we agree on  The scan will look at the chest and mainly focus on the heart and identify early signs of calcium build up or blockages within the heart arteries. It is not 100% accurate for identifying blockages or heart disease, but it is useful to help us  predict who may have some early changes or be at risk in the future for a heart attack or cardiovascular problem.  The results are reviewed by a Cardiologist and they will document the results. It should become available on MyChart. Typically the results are divided into percentiles based on other patients of the same demographic and age. So it will compare your risk to others similar to you. If you have a higher score >99 or higher percentile >75%tile, it is recommended to consider Statin cholesterol therapy and or referral to Cardiologist. I will try to help explain your results and if we have questions we can contact the Cardiologist.  You will be contacted for scheduling. Usually it is done at any imaging facility through [facility], [facility] or [facility].  The cost is $99 flat fee total and it does not go through insurance, so no authorization is required.  ------------------------------  Diet Recommendations for  Diabetes   REDUCE Starchy (carb) foods include: Bread, rice, pasta, potatoes, corn, crackers, bagels, muffins, all baked goods.   FRUITS - LIMIT these HIGH sugar/carb fruits = Pineapple, Watermelon,  Bananas - OKAY with these MEDIUM sugar/carb fruits = Citrus, Oranges, Grapes - PREFER these LOW sugar/carb fruits = Apples, Berries, Pears, Plums  Protein foods include: Meat, fish, poultry, eggs, dairy foods, and beans such as pinto and kidney beans (beans also provide carbohydrate).   1. Eat at least 3 meals and 1-2 snacks per day. Never go more than 4-5 hours while awake without eating.   2. Limit starchy foods to TWO per meal and ONE per snack. ONE portion of a starchy  food is equal to the following:   - ONE slice of bread (or its equivalent, such as half of a hamburger bun).   - 1/2 cup of a scoopable starchy food such as potatoes or rice.   - 1 OUNCE (28 grams) of starchy snacks (crackers or pretzels, look on label).   - 15 grams of carbohydrate as shown on food label.   3. Both lunch and dinner should include a protein food, a carb food, and vegetables.   - Obtain twice as many veg's as protein or carbohydrate foods for both lunch and dinner.   - Try to keep frozen veg's on hand for a quick vegetable serving.     - Fresh or frozen veg's are best.   4. Breakfast should always include protein.      Please schedule a Follow-up Appointment to: Return in about 7 months (around 08/13/2023) for 7-8 months August DM A1c, after on Medicare.  If you have any other questions or concerns, please feel free to call the office or send a  message through MyChart. You may also schedule an earlier appointment if necessary.  Additionally, you may be receiving a survey about your experience at our office within a few days to 1 week by e-mail or mail. We value your feedback.  Marsa Officer, DO [facility], [place]

## 2023-01-16 ENCOUNTER — Other Ambulatory Visit: Payer: Self-pay | Admitting: Internal Medicine

## 2023-01-16 DIAGNOSIS — K219 Gastro-esophageal reflux disease without esophagitis: Secondary | ICD-10-CM

## 2023-01-16 LAB — QUANTIFERON-TB GOLD PLUS
Mitogen-NIL: >10 [IU]/mL
NIL: 0.05 [IU]/mL
QuantiFERON-TB Gold Plus: NEGATIVE
TB1-NIL: 0.01 [IU]/mL
TB2-NIL: 0.02 [IU]/mL

## 2023-01-17 ENCOUNTER — Other Ambulatory Visit: Payer: Self-pay | Admitting: Family Medicine

## 2023-01-17 DIAGNOSIS — E1169 Type 2 diabetes mellitus with other specified complication: Secondary | ICD-10-CM

## 2023-01-18 ENCOUNTER — Inpatient Hospital Stay: Admission: RE | Admit: 2023-01-18 | Payer: Self-pay | Source: Ambulatory Visit

## 2023-01-18 NOTE — Telephone Encounter (Signed)
 Unable to refill per protocol, Rx expired. Discontinued 10/11/22.  Requested Prescriptions  Pending Prescriptions Disp Refills   TRULICITY  1.5 MG/0.5ML SOAJ [Pharmacy Med Name: TRULICITY  1.5 MG/0.5 ML PEN]  5    Sig: INJECT 1.5 MG INTO THE SKIN ONCE A WEEK.     Endocrinology:  Diabetes - GLP-1 Receptor Agonists Failed - 01/18/2023  1:50 PM      Failed - HBA1C is between 0 and 7.9 and within 180 days    Hgb A1c MFr Bld  Date Value Ref Range Status  01/07/2023 10.5 (H) <5.7 % of total Hgb Final    Comment:    For someone without known diabetes, a hemoglobin A1c value of 6.5% or greater indicates that they may have  diabetes and this should be confirmed with a follow-up  test. . For someone with known diabetes, a value <7% indicates  that their diabetes is well controlled and a value  greater than or equal to 7% indicates suboptimal  control. A1c targets should be individualized based on  duration of diabetes, age, comorbid conditions, and  other considerations. . Currently, no consensus exists regarding use of hemoglobin A1c for diagnosis of diabetes for children. SABRA Amy - Valid encounter within last 6 months    Recent Outpatient Visits           5 days ago Annual physical exam   [facility] [place], Marsa PARAS, DO   1 month ago Subcutaneous nodule of abdominal wall   [facility] [place] Marsa PARAS, [place]   3 months ago Morbid obesity [facility])   [place] [facility] [place] Marsa PARAS, DO   1 year ago Type 2 diabetes mellitus with other specified complication, without long-term current use of insulin [facility])   [place] [facility] [place] Marsa PARAS, [place]   1 year ago Morbid obesity [facility])   [facility] [place], Marsa PARAS, [place]

## 2023-01-18 NOTE — Telephone Encounter (Signed)
 Requested Prescriptions  Refused Prescriptions Disp Refills   esomeprazole  (NEXIUM ) 20 MG capsule [Pharmacy Med Name: ESOMEPRAZOLE  MAG DR 20 MG CAP] 90 capsule 1    Sig: TAKE 1 CAPSULE BY MOUTH EVERY DAY BEFORE BREAKFAST     Gastroenterology: Proton Pump Inhibitors 2 Failed - 01/18/2023 10:55 AM      Failed - ALT in normal range and within 360 days    ALT  Date Value Ref Range Status  01/07/2023 31 (H) 6 - 29 U/L Final         Passed - AST in normal range and within 360 days    AST  Date Value Ref Range Status  01/07/2023 17 10 - 35 U/L Final         Passed - Valid encounter within last 12 months    Recent Outpatient Visits           5 days ago Annual physical exam   [place] [facility] [place], Marsa PARAS, DO   1 month ago Subcutaneous nodule of abdominal wall   [place] [facility] [place] Marsa PARAS, DO   3 months ago Morbid obesity [facility])   [place] [facility] [place], Marsa PARAS, DO   1 year ago Type 2 diabetes mellitus with other specified complication, without long-term current use of insulin [facility])   [place] [facility] [place] Marsa PARAS, [place]   1 year ago Morbid obesity [facility])    [facility] [place], Marsa PARAS, [place]

## 2023-01-19 ENCOUNTER — Encounter: Payer: Self-pay | Admitting: Family Medicine

## 2023-01-19 ENCOUNTER — Other Ambulatory Visit: Payer: Self-pay

## 2023-01-19 DIAGNOSIS — E1169 Type 2 diabetes mellitus with other specified complication: Secondary | ICD-10-CM

## 2023-01-19 MED ORDER — TRULICITY 3 MG/0.5ML ~~LOC~~ SOAJ: 3.0000 mg | SUBCUTANEOUS | 5 refills | Status: DC

## 2023-01-20 ENCOUNTER — Ambulatory Visit
Admission: RE | Admit: 2023-01-20 | Discharge: 2023-01-20 | Disposition: A | Discharge status: Home / Self Care | Payer: 59 | Source: Ambulatory Visit | Attending: Family Medicine | Admitting: Family Medicine

## 2023-01-20 ENCOUNTER — Ambulatory Visit
Admission: RE | Admit: 2023-01-20 | Discharge: 2023-01-20 | Disposition: A | Discharge status: Home / Self Care | Payer: 59 | Attending: Family Medicine | Admitting: Family Medicine

## 2023-01-20 DIAGNOSIS — G8929 Other chronic pain: Secondary | ICD-10-CM

## 2023-01-20 DIAGNOSIS — M25561 Pain in right knee: Secondary | ICD-10-CM | POA: Insufficient documentation

## 2023-01-20 DIAGNOSIS — M25461 Effusion, right knee: Secondary | ICD-10-CM | POA: Diagnosis not present

## 2023-01-26 ENCOUNTER — Ambulatory Visit
Admission: RE | Admit: 2023-01-26 | Discharge: 2023-01-26 | Disposition: A | Discharge status: Home / Self Care | Payer: 59 | Source: Ambulatory Visit | Attending: Family Medicine | Admitting: Family Medicine

## 2023-01-26 DIAGNOSIS — E1169 Type 2 diabetes mellitus with other specified complication: Secondary | ICD-10-CM | POA: Insufficient documentation

## 2023-01-26 DIAGNOSIS — E785 Hyperlipidemia, unspecified: Secondary | ICD-10-CM | POA: Insufficient documentation

## 2023-01-27 ENCOUNTER — Encounter: Payer: Self-pay | Admitting: Family Medicine

## 2023-02-08 NOTE — Progress Notes (Signed)
 PCP: Edman Marsa PARAS, DO   Chief Complaint  Patient presents with   Gynecologic Exam    No concerns    HPI:      Ms. Hannah Cobb is a 63 y.o. G0P0000 whose LMP was No LMP recorded. Patient is postmenopausal., presents today for her annual examination.  Her menses are absent due to menopause. No PMB, no vasomotor sx.   Sex activity: single partner, contraception - post menopausal status. She does have vaginal dryness, improved with lubricants. Started using coconut oil last yr and doing well.   Last Pap: 07/03/20  Results were: no abnormalities /neg HPV DNA.   Last mammogram: 02/23/22  Results were: normal--routine follow-up in 12 months There is no FH of breast cancer. There is no FH of ovarian cancer. The patient does do self-breast exams.  Colonoscopy: 2019 at [facility]; Repeat due after 8 years per pt.   Tobacco use: The patient denies current or previous tobacco use. Alcohol use: none No drug use Exercise: min active  She does get adequate calcium and Vitamin D in her diet.  Labs with PCP.   Patient Active Problem List   Diagnosis Date Noted   Episodic migraine 01/15/2022   Daytime sleepiness 03/19/2021   Morbid obesity (HCC) 03/19/2021   Varicose veins of leg with pain, bilateral 07/11/2020   Psoriasis    GERD (gastroesophageal reflux disease)    Diabetes mellitus (HCC)    Allergic rhinitis     Past Surgical History:  Procedure Laterality Date   COLONOSCOPY  2009   diverticulosis   ORIF ANKLE FRACTURE Left Dec    jan 1991-removal of hardware   SEPTOPLASTY  1985   deviated septum   TONSILLECTOMY AND ADENOIDECTOMY  1978    Family History  Problem Relation Age of Onset   Cirrhosis Mother        nonalcoholic   Diabetes Mother    Heart disease Mother    Hypertension Mother    Osteoporosis Mother    Neuropathy Mother    Cancer Mother        bile duct, liver   Hypertension Father    Lymphoma Brother 30   Hypertension Maternal Grandmother     Heart disease Maternal Grandmother    Diabetes Maternal Grandfather    Hypertension Maternal Grandfather    Heart disease Maternal Grandfather    Diabetes Paternal Grandmother    Breast cancer Neg Hx     Social History   Socioeconomic History   Marital status: Married    Spouse name: Belvie since 2004   Number of children: Not on file   Years of education: Not on file   Highest education level: Not on file  Occupational History   Occupation: Lab Tech  Tobacco Use   Smoking status: Never   Smokeless tobacco: Never  Vaping Use   Vaping status: Never Used  Substance and Sexual Activity   Alcohol use: Yes    Alcohol/week: 1.0 standard drink of alcohol    Types: 1 Standard drinks or equivalent per week    Comment: rare   Drug use: No   Sexual activity: Yes    Partners: Male    Birth control/protection: Post-menopausal  Other Topics Concern   Not on file  Social History Narrative   Not on file   Social Drivers of Health   Financial Resource Strain: Low Risk  (12/15/2022)   Overall Financial Resource Strain (CARDIA)    Difficulty of Paying Living Expenses: Not  hard at all  Food Insecurity: No Food Insecurity (12/15/2022)   Hunger Vital Sign    Worried About Running Out of Food in the Last Year: Never true    Ran Out of Food in the Last Year: Never true  Transportation Needs: No Transportation Needs (12/15/2022)   PRAPARE - Administrator, Civil Service (Medical): No    Lack of Transportation (Non-Medical): No  Physical Activity: Insufficiently Active (12/15/2022)   Exercise Vital Sign    Days of Exercise per Week: 4 days    Minutes of Exercise per Session: 30 min  Stress: No Stress Concern Present (12/15/2022)   [employer] of Occupational Health - Occupational Stress Questionnaire    Feeling of Stress : Not at all  Social Connections: Socially Integrated (12/15/2022)   Social Connection and Isolation Panel [NHANES]    Frequency of Communication  with Friends and Family: More than three times a week    Frequency of Social Gatherings with Friends and Family: More than three times a week    Attends Religious Services: More than 4 times per year    Active Member of [employer] or Organizations: No    Attends Banker Meetings: 1 to 4 times per year    Marital Status: Married  Catering Manager Violence: Not At Risk (12/15/2022)   Humiliation, Afraid, Rape, and Kick questionnaire    Fear of Current or Ex-Partner: No    Emotionally Abused: No    Physically Abused: No    Sexually Abused: No     Current Outpatient Medications:    Cholecalciferol (VITAMIN D3) 10 MCG (400 UNIT) tablet, Take 400 Units by mouth daily., Disp: , Rfl:    clobetasol (TEMOVATE) 0.05 % external solution, Apply topically., Disp: , Rfl:    clorazepate  (TRANXENE ) 7.5 MG tablet, Take 1-2 tablets (7.5-15 mg total) by mouth 2 (two) times daily as needed for anxiety., Disp: 30 tablet, Rfl: 5   docusate sodium (COLACE) 100 MG capsule, Take 100 mg by mouth daily., Disp: , Rfl:    esomeprazole  (NEXIUM ) 20 MG capsule, TAKE 1 CAPSULE BY MOUTH EVERY DAY BEFORE BREAKFAST, Disp: 90 capsule, Rfl: 0   gabapentin  (NEURONTIN ) 300 MG capsule, Take 300 mg by mouth daily., Disp: , Rfl:    metFORMIN  (GLUCOPHAGE -XR) 500 MG 24 hr tablet, Take 2 tablets in morning with meal and take 1 tablet in evening with meal., Disp: 270 tablet, Rfl: 3   Multiple Vitamins-Minerals (CENTRUM WOMEN PO), Take by mouth., Disp: , Rfl:    STELARA 90 MG/ML SOSY injection, Inject into the skin., Disp: , Rfl:    TRULICITY  3 MG/0.5ML SOAJ, Inject 3 mg as directed once a week., Disp: 2 mL, Rfl: 5     ROS:  Review of Systems  Constitutional:  Negative for fatigue, fever and unexpected weight change.  Respiratory:  Negative for cough, shortness of breath and wheezing.   Cardiovascular:  Negative for chest pain, palpitations and leg swelling.  Gastrointestinal:  Negative for blood in stool, constipation,  diarrhea, nausea and vomiting.  Endocrine: Negative for cold intolerance, heat intolerance and polyuria.  Genitourinary:  Negative for dyspareunia, dysuria, flank pain, frequency, genital sores, hematuria, menstrual problem, pelvic pain, urgency, vaginal bleeding, vaginal discharge and vaginal pain.  Musculoskeletal:  Negative for back pain, joint swelling and myalgias.  Skin:  Negative for rash.  Neurological:  Negative for dizziness, syncope, light-headedness, numbness and headaches.  Hematological:  Negative for adenopathy.  Psychiatric/Behavioral:  Negative for agitation, confusion, sleep  disturbance and suicidal ideas. The patient is not nervous/anxious.    BREAST: No symptoms    Objective: BP 131/75   Pulse 75   Ht 5' 9 (1.753 m)   Wt 261 lb (118.4 kg)   BMI 38.54 kg/m    Physical Exam Constitutional:      Appearance: She is well-developed.  Genitourinary:     Vulva normal.     Right Labia: No rash, tenderness or lesions.    Left Labia: No tenderness, lesions or rash.    No vaginal discharge, erythema or tenderness.      Right Adnexa: not tender and no mass present.    Left Adnexa: not tender and no mass present.    No cervical friability, lesion or polyp.     Uterus is not enlarged or tender.  Breasts:    Right: No mass, nipple discharge, skin change or tenderness.     Left: No mass, nipple discharge, skin change or tenderness.  Neck:     Thyroid: No thyromegaly.  Cardiovascular:     Rate and Rhythm: Normal rate and regular rhythm.     Heart sounds: Normal heart sounds. No murmur heard. Pulmonary:     Effort: Pulmonary effort is normal.     Breath sounds: Normal breath sounds.  Abdominal:     Palpations: Abdomen is soft.     Tenderness: There is no abdominal tenderness. There is no guarding or rebound.  Musculoskeletal:        General: Normal range of motion.     Cervical back: Normal range of motion.  Lymphadenopathy:     Cervical: No cervical adenopathy.   Neurological:     General: No focal deficit present.     Mental Status: She is alert and oriented to person, place, and time.     Cranial Nerves: No cranial nerve deficit.  Skin:    General: Skin is warm and dry.  Psychiatric:        Mood and Affect: Mood normal.        Behavior: Behavior normal.        Thought Content: Thought content normal.        Judgment: Judgment normal.  Vitals reviewed.    Assessment/Plan:  Encounter for annual routine gynecological examination  Encounter for screening mammogram for malignant neoplasm of breast - Plan: MM 3D SCREENING MAMMOGRAM BILATERAL BREAST; pt to schedule mammo  Dyspareunia in female--improved with coconut oil. Can do vag ERT if needed.         GYN counsel breast self exam, mammography screening, menopause, adequate intake of calcium and vitamin D, diet and exercise    F/U  Return in about 1 year (around 02/10/2024).  Antonella Upson B. Laneice Meneely, PA-C 02/10/2023 9:00 AM

## 2023-02-10 ENCOUNTER — Encounter: Payer: Self-pay | Admitting: Obstetrics and Gynecology

## 2023-02-10 ENCOUNTER — Ambulatory Visit (INDEPENDENT_AMBULATORY_CARE_PROVIDER_SITE_OTHER): Payer: Self-pay | Admitting: Obstetrics and Gynecology

## 2023-02-10 VITALS — BP 131/75 | HR 75 | Ht 69.0 in | Wt 261.0 lb

## 2023-02-10 DIAGNOSIS — N930 Postcoital and contact bleeding: Secondary | ICD-10-CM

## 2023-02-10 DIAGNOSIS — Z1231 Encounter for screening mammogram for malignant neoplasm of breast: Secondary | ICD-10-CM

## 2023-02-10 DIAGNOSIS — Z01419 Encounter for gynecological examination (general) (routine) without abnormal findings: Secondary | ICD-10-CM

## 2023-02-10 DIAGNOSIS — N941 Unspecified dyspareunia: Secondary | ICD-10-CM

## 2023-02-10 DIAGNOSIS — N951 Menopausal and female climacteric states: Secondary | ICD-10-CM

## 2023-02-10 NOTE — Patient Instructions (Addendum)
 I value your feedback and you entrusting [place] with your care. If you get a [facility] patient survey, I would appreciate you taking the time to let us know about your experience today. Thank you!  [facility] ([place])--[phone]

## 2023-02-11 ENCOUNTER — Other Ambulatory Visit: Payer: Self-pay | Admitting: Internal Medicine

## 2023-02-11 DIAGNOSIS — K219 Gastro-esophageal reflux disease without esophagitis: Secondary | ICD-10-CM

## 2023-02-14 NOTE — Telephone Encounter (Signed)
 Requested Prescriptions  Pending Prescriptions Disp Refills   esomeprazole  (NEXIUM ) 20 MG capsule [Pharmacy Med Name: ESOMEPRAZOLE  MAG DR 20 MG CAP] 90 capsule 0    Sig: TAKE 1 CAPSULE BY MOUTH EVERY DAY BEFORE BREAKFAST     Gastroenterology: Proton Pump Inhibitors 2 Failed - 02/14/2023 11:02 AM      Failed - ALT in normal range and within 360 days    ALT  Date Value Ref Range Status  01/07/2023 31 (H) 6 - 29 U/L Final         Passed - AST in normal range and within 360 days    AST  Date Value Ref Range Status  01/07/2023 17 10 - 35 U/L Final         Passed - Valid encounter within last 12 months    Recent Outpatient Visits           1 month ago Annual physical exam   [place] [facility] [place], Kayleen Party, DO   2 months ago Subcutaneous nodule of abdominal wall   [place] [facility] Raina Bunting, DO   4 months ago Morbid obesity [facility])   [place] [facility] [place], Kayleen Party, DO   1 year ago Type 2 diabetes mellitus with other specified complication, without long-term current use of insulin [facility])   [place] [facility] Raina Bunting, DO   1 year ago Morbid obesity [facility])   [place] [facility] [place], Kayleen Party, [place]

## 2023-02-20 ENCOUNTER — Other Ambulatory Visit: Payer: Self-pay | Admitting: Family Medicine

## 2023-02-20 DIAGNOSIS — E1169 Type 2 diabetes mellitus with other specified complication: Secondary | ICD-10-CM

## 2023-02-21 NOTE — Telephone Encounter (Signed)
Unable to refill per protocol, Rx expired. Discontinued 10/11/22.  Requested Prescriptions  Pending Prescriptions Disp Refills   TRULICITY 1.5 MG/0.5ML SOAJ [Pharmacy Med Name: TRULICITY 1.5 MG/0.5 ML PEN]  5    Sig: INJECT 1.5 MG INTO THE SKIN ONCE A WEEK.     Endocrinology:  Diabetes - GLP-1 Receptor Agonists Failed - 02/21/2023  4:17 PM      Failed - HBA1C is between 0 and 7.9 and within 180 days    Hgb A1c MFr Bld  Date Value Ref Range Status  01/07/2023 10.5 (H) <5.7 % of total Hgb Final    Comment:    For someone without known diabetes, a hemoglobin A1c value of 6.5% or greater indicates that they may have  diabetes and this should be confirmed with a follow-up  test. . For someone with known diabetes, a value <7% indicates  that their diabetes is well controlled and a value  greater than or equal to 7% indicates suboptimal  control. A1c targets should be individualized based on  duration of diabetes, age, comorbid conditions, and  other considerations. . Currently, no consensus exists regarding use of hemoglobin A1c for diagnosis of diabetes for children. Verna Czech - Valid encounter within last 6 months    Recent Outpatient Visits           1 month ago Annual physical exam   [place] [facility] [place], Netta Neat, DO   2 months ago Subcutaneous nodule of abdominal wall   [place] [facility] Smitty Cords, DO   4 months ago Morbid obesity [facility])   [place] [facility] Smitty Cords, DO   1 year ago Type 2 diabetes mellitus with other specified complication, without long-term current use of insulin [facility])   [place] [facility] Smitty Cords, DO   1 year ago Morbid obesity [facility])   [place] [facility] [place], Netta Neat, [place]

## 2023-02-28 ENCOUNTER — Ambulatory Visit
Admission: RE | Admit: 2023-02-28 | Discharge: 2023-02-28 | Disposition: A | Discharge status: Home / Self Care | Payer: 59 | Source: Ambulatory Visit | Attending: Obstetrics and Gynecology | Admitting: Obstetrics and Gynecology

## 2023-02-28 DIAGNOSIS — Z1231 Encounter for screening mammogram for malignant neoplasm of breast: Secondary | ICD-10-CM | POA: Diagnosis not present

## 2023-03-01 ENCOUNTER — Ambulatory Visit: Payer: 59 | Admitting: Family Medicine

## 2023-03-01 ENCOUNTER — Encounter: Payer: Self-pay | Admitting: Family Medicine

## 2023-03-01 VITALS — BP 120/82 | HR 80 | Temp 97.7°F | Ht 69.0 in | Wt 261.0 lb

## 2023-03-01 DIAGNOSIS — J40 Bronchitis, not specified as acute or chronic: Secondary | ICD-10-CM

## 2023-03-01 MED ORDER — BENZONATATE 100 MG PO CAPS: 100.0000 mg | ORAL_CAPSULE | Freq: Three times a day (TID) | ORAL | 0 refills | Status: DC | PRN

## 2023-03-01 MED ORDER — AZITHROMYCIN 250 MG PO TABS: ORAL_TABLET | ORAL | 0 refills | Status: DC

## 2023-03-01 MED ORDER — HYDROCOD POLI-CHLORPHE POLI ER 10-8 MG/5ML PO SUER
5.0000 mL | Freq: Two times a day (BID) | ORAL | 0 refills | Status: DC | PRN
Start: 2023-03-01 — End: 2023-08-31

## 2023-03-01 NOTE — Progress Notes (Signed)
 Subjective:    Patient ID: Hannah Cobb, female    DOB: [birth year], 63 y.o.   MRN: [id number]  Hannah Cobb is a 63 y.o. female presenting on 03/01/2023 for Bronchitis  Patient presents for a same day appointment.   HPI  Discussed the use of AI scribe software for clinical note transcription with the patient, who gave verbal consent to proceed.  History of Present Illness   Hannah Cobb "Hannah Cobb" is a 63 year old female who presents with respiratory symptoms and difficulty breathing.  She has been experiencing respiratory symptoms and difficulty breathing for the past week. Initially, she had laryngitis, a sore throat, and a stuffy nose. Despite using a neti pot, an orange immune booster drink with vitamin C, generic Mucinex, and Nyquil, her symptoms have persisted. Last night, she had significant difficulty breathing, which interfered with her ability to use her CPAP machine effectively.  She describes her cough as wet but not productive enough to expel mucus, stating 'it comes up and then it drizzles back down.' Her symptoms worsen when lying down, prompting her to consider sleeping in a recliner to alleviate the cough. She has been using Flonase at night after the neti pot, but it has not improved her breathing difficulties. She has a history of using albuterol inhalers, which she found helpful in the past, but she is uncertain about their current necessity.  Her brother has been hospitalized since Friday, which has been a source of stress for her, especially considering the exposure to other sick individuals in the hospital environment.  No significant fever. Cough is worse when lying down. No wheezing or rattling sounds in her chest.          01/13/2023    8:47 AM 12/15/2022    4:18 PM 10/11/2022    8:48 AM  Depression screen PHQ 2/9  Decreased Interest 0 0 0  Down, Depressed, Hopeless 0 0 0  PHQ - 2 Score 0 0 0  Altered sleeping   0  Tired, decreased energy   0  Change  in appetite   0  Feeling bad or failure about yourself    0  Trouble concentrating   0  Moving slowly or fidgety/restless   0  Suicidal thoughts   0  PHQ-9 Score   0  Difficult doing work/chores   Not difficult at all       01/13/2023    8:47 AM 12/15/2022    4:18 PM 10/11/2022    8:48 AM  GAD 7 : Generalized Anxiety Score  Nervous, Anxious, on Edge 0 0 0  Control/stop worrying 0 0 0  Worry too much - different things 0 0 0  Trouble relaxing 0 0 0  Restless 0 0 0  Easily annoyed or irritable 0 0 0  Afraid - awful might happen 0 0 0  Total GAD 7 Score 0 0 0  Anxiety Difficulty   Not difficult at all    Social History   Tobacco Use   Smoking status: Never   Smokeless tobacco: Never  Vaping Use   Vaping status: Never Used  Substance Use Topics   Alcohol use: Yes    Alcohol/week: 1.0 standard drink of alcohol    Types: 1 Standard drinks or equivalent per week    Comment: rare   Drug use: No    Review of Systems Per HPI unless specifically indicated above     Objective:    BP 120/82  Pulse 80   Temp 97.7 F (36.5 C)   Ht 5\' 9"  (1.753 m)   Wt 261 lb (118.4 kg)   SpO2 90%   BMI 38.54 kg/m   Wt Readings from Last 3 Encounters:  03/01/23 261 lb (118.4 kg)  02/10/23 261 lb (118.4 kg)  01/13/23 268 lb (121.6 kg)    Physical Exam Vitals and nursing note reviewed.  Constitutional:      General: She is not in acute distress.    Appearance: She is well-developed. She is not diaphoretic.     Comments: Well-appearing, comfortable, cooperative  HENT:     Head: Normocephalic and atraumatic.     Comments: Hoarse voice    Nose: Congestion present.  Eyes:     General:        Right eye: No discharge.        Left eye: No discharge.     Conjunctiva/sclera: Conjunctivae normal.  Neck:     Thyroid: No thyromegaly.  Cardiovascular:     Rate and Rhythm: Normal rate and regular rhythm.     Heart sounds: Normal heart sounds. No murmur heard. Pulmonary:     Effort:  Pulmonary effort is normal. No respiratory distress.     Breath sounds: No wheezing or rales.     Comments: Slightly tight air movement. No wheezing or focal findings Musculoskeletal:        General: Normal range of motion.     Cervical back: Normal range of motion and neck supple.  Lymphadenopathy:     Cervical: No cervical adenopathy.  Skin:    General: Skin is warm and dry.     Findings: No erythema or rash.  Neurological:     Mental Status: She is alert and oriented to person, place, and time.  Psychiatric:        Behavior: Behavior normal.     Comments: Well groomed, good eye contact, normal speech and thoughts     Results for orders placed or performed in visit on 01/13/23  HM COLONOSCOPY   Collection Time: 07/04/17 12:00 AM  Result Value Ref Range   HM Colonoscopy See Report (in chart) See Report (in chart), Patient Reported  QuantiFERON-TB Gold Plus   Collection Time: 01/13/23 11:29 AM  Result Value Ref Range   QuantiFERON-TB Gold Plus NEGATIVE NEGATIVE   NIL 0.05 IU/mL   Mitogen-NIL >10.00 IU/mL   TB1-NIL 0.01 IU/mL   TB2-NIL 0.02 IU/mL      Assessment & Plan:   Problem List Items Addressed This Visit   None Visit Diagnoses       Bronchitis    -  Primary   Relevant Medications   azithromycin (ZITHROMAX Z-PAK) 250 MG tablet   benzonatate (TESSALON) 100 MG capsule   chlorpheniramine-HYDROcodone (TUSSIONEX) 10-8 MG/5ML        Bronchitis, Lower Respiratory Infection Symptoms of sore throat, nasal congestion, and cough for one week. Cough is productive but patient is unable to expectorate. No signs of pneumonia on examination.  -Start Azithromycin (Z-Pak) for broad coverage (has not done well with Amoxicillin in the past) -Start [employer] for daytime cough suppression. -Start Hydrocodone cough syrup for nighttime cough suppression. -Continue over-the-counter treatments including Mucinex, vitamin C, neti pot, and Flonase. -Discontinue Nyquil if  starting prescription cough syrup. -Consider Albuterol inhaler if symptoms do not improve.  Sleep Apnea Difficulty using CPAP machine due to nasal congestion. -Continue efforts to manage nasal congestion to improve CPAP use.   No orders of the  defined types were placed in this encounter.   Meds ordered this encounter  Medications   azithromycin (ZITHROMAX Z-PAK) 250 MG tablet    Sig: Take 2 tabs (500mg  total) on Day 1. Take 1 tab (250mg ) daily for next 4 days.    Dispense:  6 tablet    Refill:  0   benzonatate (TESSALON) 100 MG capsule    Sig: Take 1 capsule (100 mg total) by mouth 3 (three) times daily as needed for cough.    Dispense:  30 capsule    Refill:  0   chlorpheniramine-HYDROcodone (TUSSIONEX) 10-8 MG/5ML    Sig: Take 5 mLs by mouth every 12 (twelve) hours as needed for cough.    Dispense:  115 mL    Refill:  0    Follow up plan: Return if symptoms worsen or fail to improve.  Saralyn Pilar, DO [facility] 03/01/2023, 8:37 AM

## 2023-03-01 NOTE — Patient Instructions (Addendum)
 Thank you for coming to the office today.  Acute Bronchitis, Adult  Acute bronchitis is sudden inflammation of the main airways (bronchi) that come off the windpipe (trachea) in the lungs. The swelling causes the airways to get smaller and make more mucus than normal. This can make it hard to breathe and can cause coughing or noisy breathing (wheezing). Acute bronchitis may last several weeks. The cough may last longer. Allergies, asthma, and exposure to smoke may make the condition worse. What are the causes? This condition can be caused by germs and by substances that irritate the lungs, including: Cold and flu viruses. The most common cause of this condition is the virus that causes the common cold. Bacteria. This is less common. Breathing in substances that irritate the lungs, including: Smoke from cigarettes and other forms of tobacco. Dust and pollen. Fumes from household cleaning products, gases, or burned fuel. Indoor or outdoor air pollution. What increases the risk? The following factors may make you more likely to develop this condition: A weak body's defense system, also called the immune system. A condition that affects your lungs and breathing, such as asthma. What are the signs or symptoms? Common symptoms of this condition include: Coughing. This may bring up clear, yellow, or green mucus from your lungs (sputum). Wheezing. Runny or stuffy nose. Having too much mucus in your lungs (chest congestion). Shortness of breath. Aches and pains, including sore throat or chest. How is this diagnosed? This condition is usually diagnosed based on: Your symptoms and medical history. A physical exam. You may also have other tests, including tests to rule out other conditions, such as pneumonia. These tests include: A test of lung function. Test of a mucus sample to look for the presence of bacteria. Tests to check the oxygen level in your blood. Blood tests. Chest X-ray. How  is this treated? Most cases of acute bronchitis clear up over time without treatment. Your health care provider may recommend: Drinking more fluids to help thin your mucus so it is easier to cough up. Taking inhaled medicine (inhaler) to improve air flow in and out of your lungs. Using a vaporizer or a humidifier. These are machines that add water to the air to help you breathe better. Taking a medicine that thins mucus and clears congestion (expectorant). Taking a medicine that prevents or stops coughing (cough suppressant). It is not common to take an antibiotic medicine for this condition. Follow these instructions at home:  Take over-the-counter and prescription medicines only as told by your health care provider. Use an inhaler, vaporizer, or humidifier as told by your health care provider. Take two teaspoons (10 mL) of honey at bedtime to lessen coughing at night. Drink enough fluid to keep your urine pale yellow. Do not use any products that contain nicotine or tobacco. These products include cigarettes, chewing tobacco, and vaping devices, such as e-cigarettes. If you need help quitting, ask your health care provider. Get plenty of rest. Return to your normal activities as told by your health care provider. Ask your health care provider what activities are safe for you. Keep all follow-up visits. This is important. How is this prevented? To lower your risk of getting this condition again: Wash your hands often with soap and water for at least 20 seconds. If soap and water are not available, use hand sanitizer. Avoid contact with people who have cold symptoms. Try not to touch your mouth, nose, or eyes with your hands. Avoid breathing in smoke or  chemical fumes. Breathing smoke or chemical fumes will make your condition worse. Get the flu shot every year. Contact a health care provider if: Your symptoms do not improve after 2 weeks. You have trouble coughing up the mucus. Your  cough keeps you awake at night. You have a fever. Get help right away if you: Cough up blood. Feel pain in your chest. Have severe shortness of breath. Faint or keep feeling like you are going to faint. Have a severe headache. Have a fever or chills that get worse. These symptoms may represent a serious problem that is an emergency. Do not wait to see if the symptoms will go away. Get medical help right away. Call your local emergency services (911 in the [place] Do not drive yourself to the hospital. Summary Acute bronchitis is inflammation of the main airways (bronchi) that come off the windpipe (trachea) in the lungs. The swelling causes the airways to get smaller and make more mucus than normal. Drinking more fluids can help thin your mucus so it is easier to cough up. Take over-the-counter and prescription medicines only as told by your health care provider. Do not use any products that contain nicotine or tobacco. These products include cigarettes, chewing tobacco, and vaping devices, such as e-cigarettes. If you need help quitting, ask your health care provider. Contact a health care provider if your symptoms do not improve after 2 weeks. This information is not intended to replace advice given to you by your health care provider. Make sure you discuss any questions you have with your health care provider. Document Revised: 04/02/2021 Document Reviewed: 04/23/2020 Elsevier Patient Education  2024 Elsevier Inc.   Please schedule a Follow-up Appointment to: Return if symptoms worsen or fail to improve.  If you have any other questions or concerns, please feel free to call the office or send a message through MyChart. You may also schedule an earlier appointment if necessary.  Additionally, you may be receiving a survey about your experience at our office within a few days to 1 week by e-mail or mail. We value your feedback.  Saralyn Pilar, DO [facility],  [place]

## 2023-03-03 ENCOUNTER — Encounter: Payer: Self-pay | Admitting: Obstetrics and Gynecology

## 2023-03-16 DIAGNOSIS — G4733 Obstructive sleep apnea (adult) (pediatric): Secondary | ICD-10-CM | POA: Diagnosis not present

## 2023-03-27 ENCOUNTER — Other Ambulatory Visit: Payer: Self-pay | Admitting: Family Medicine

## 2023-03-27 DIAGNOSIS — E1169 Type 2 diabetes mellitus with other specified complication: Secondary | ICD-10-CM

## 2023-03-28 NOTE — Telephone Encounter (Signed)
 Requested Prescriptions  Refused Prescriptions Disp Refills   TRULICITY 1.5 MG/0.5ML SOAJ [Pharmacy Med Name: TRULICITY 1.5 MG/0.5 ML PEN]  5    Sig: INJECT 1.5 MG INTO THE SKIN ONCE A WEEK.     Endocrinology:  Diabetes - GLP-1 Receptor Agonists Failed - 03/28/2023  5:50 PM      Failed - HBA1C is between 0 and 7.9 and within 180 days    Hgb A1c MFr Bld  Date Value Ref Range Status  01/07/2023 10.5 (H) <5.7 % of total Hgb Final    Comment:    For someone without known diabetes, a hemoglobin A1c value of 6.5% or greater indicates that they may have  diabetes and this should be confirmed with a follow-up  test. . For someone with known diabetes, a value <7% indicates  that their diabetes is well controlled and a value  greater than or equal to 7% indicates suboptimal  control. A1c targets should be individualized based on  duration of diabetes, age, comorbid conditions, and  other considerations. . Currently, no consensus exists regarding use of hemoglobin A1c for diagnosis of diabetes for children. Verna Czech - Valid encounter within last 6 months    Recent Outpatient Visits           2 months ago Annual physical exam   [place] [facility] [place], Netta Neat, DO   3 months ago Subcutaneous nodule of abdominal wall   [place] [facility] Smitty Cords, DO   5 months ago Morbid obesity [facility])   [place] [facility] Smitty Cords, DO   1 year ago Type 2 diabetes mellitus with other specified complication, without long-term current use of insulin [facility])   [place] [facility] Smitty Cords, DO   1 year ago Morbid obesity [facility])   [place] [facility] [place], Netta Neat, [place]

## 2023-04-08 DIAGNOSIS — M9901 Segmental and somatic dysfunction of cervical region: Secondary | ICD-10-CM | POA: Diagnosis not present

## 2023-04-08 DIAGNOSIS — M542 Cervicalgia: Secondary | ICD-10-CM | POA: Diagnosis not present

## 2023-04-08 DIAGNOSIS — M9903 Segmental and somatic dysfunction of lumbar region: Secondary | ICD-10-CM | POA: Diagnosis not present

## 2023-04-08 DIAGNOSIS — M545 Low back pain, unspecified: Secondary | ICD-10-CM | POA: Diagnosis not present

## 2023-04-11 ENCOUNTER — Telehealth: Payer: Self-pay

## 2023-04-11 NOTE — Telephone Encounter (Signed)
 Okay. So if she last filled Trulicity on 03/27/23 then 4 weeks or 28 days is going to be exactly 04/24/23. So if she cannot pick it up precisely on 04/24/23, she will need to check with pharmacy what the earliest fill date is available. They may be able to allow her 1-2 days in advance of 04/24/23 as an early fill. I am not sure if they need my permission to do 1-2 days early. Regardless it is probably too early to call this change in now.  I would say she should ask the pharmacy first and see if they can fill it early by 1-2 days for her first. If they need our verbal order, then she can let us know, and then one of [place] can call the pharmacy and provide a verbal if that is all they need. Again they may have a higher price quote to fill it early, this is all up to pharmacy, not [place].  Thank you!  Saralyn Pilar, DO [facility] 04/11/2023, 4:44 PM

## 2023-04-11 NOTE — Telephone Encounter (Signed)
 Possibly! This is actually something that just needs to be worked out with the pharmacy. We can't see what date she picked up the last order on our end, so it is usually 28 days after last fill that she is eligible to get the next refill.  It depends on the date she got it filled last and the date she needs to get it filled by before 04/24/23.  Once we have that information those dates, then we can always call the pharmacy and give them verbal permission to fill early if she cannot get them to agree without our verbal orders.  Sometimes, there may be higher cost with an early fill however, we may not be able to control that part.  Saralyn Pilar, DO [facility] 04/11/2023, 4:21 PM

## 2023-04-11 NOTE — Telephone Encounter (Signed)
 Copied from CRM [id number]. Topic: Clinical - Medication Question >> Apr 11, 2023  3:21 PM Fredrich Romans wrote: Reason for CRM: Patient stated that she will be leaving for vacation on April 20,and will be gone for 1 month.She would like to know if she could receive there refill for TRULICITY 3 MG/0.5ML SOAJ a little early?

## 2023-04-11 NOTE — Telephone Encounter (Signed)
 Spoke with patient, explained that she will have to wait until it gets closer to her vacation. But we are ok with her getting it filled early. She will notify us if we need to call back to pharmacy.

## 2023-04-15 DIAGNOSIS — M545 Low back pain, unspecified: Secondary | ICD-10-CM | POA: Diagnosis not present

## 2023-04-15 DIAGNOSIS — M9901 Segmental and somatic dysfunction of cervical region: Secondary | ICD-10-CM | POA: Diagnosis not present

## 2023-04-15 DIAGNOSIS — M9903 Segmental and somatic dysfunction of lumbar region: Secondary | ICD-10-CM | POA: Diagnosis not present

## 2023-04-15 DIAGNOSIS — M542 Cervicalgia: Secondary | ICD-10-CM | POA: Diagnosis not present

## 2023-05-09 DIAGNOSIS — G4733 Obstructive sleep apnea (adult) (pediatric): Secondary | ICD-10-CM | POA: Diagnosis not present

## 2023-05-11 DIAGNOSIS — M545 Low back pain, unspecified: Secondary | ICD-10-CM | POA: Diagnosis not present

## 2023-05-11 DIAGNOSIS — M542 Cervicalgia: Secondary | ICD-10-CM | POA: Diagnosis not present

## 2023-05-11 DIAGNOSIS — M9901 Segmental and somatic dysfunction of cervical region: Secondary | ICD-10-CM | POA: Diagnosis not present

## 2023-05-11 DIAGNOSIS — M9903 Segmental and somatic dysfunction of lumbar region: Secondary | ICD-10-CM | POA: Diagnosis not present

## 2023-05-21 ENCOUNTER — Other Ambulatory Visit: Payer: Self-pay | Admitting: Family Medicine

## 2023-05-21 DIAGNOSIS — K219 Gastro-esophageal reflux disease without esophagitis: Secondary | ICD-10-CM

## 2023-05-24 NOTE — Telephone Encounter (Signed)
 Requested Prescriptions  Pending Prescriptions Disp Refills   esomeprazole  (NEXIUM ) 20 MG capsule [Pharmacy Med Name: ESOMEPRAZOLE  MAG DR 20 MG CAP] 30 capsule 2    Sig: TAKE 1 CAPSULE BY MOUTH EVERY DAY BEFORE BREAKFAST     Gastroenterology: Proton Pump Inhibitors 2 Failed - 05/24/2023 11:01 AM      Failed - ALT in normal range and within 360 days    ALT  Date Value Ref Range Status  01/07/2023 31 (H) 6 - 29 U/L Final         Failed - Valid encounter within last 12 months    Recent Outpatient Visits           2 months ago Bronchitis   [place] [facility] [place], Kayleen Party, DO              Passed - AST in normal range and within 360 days    AST  Date Value Ref Range Status  01/07/2023 17 10 - 35 U/L Final

## 2023-08-18 ENCOUNTER — Other Ambulatory Visit: Payer: Self-pay | Admitting: Family Medicine

## 2023-08-18 DIAGNOSIS — K219 Gastro-esophageal reflux disease without esophagitis: Secondary | ICD-10-CM

## 2023-08-19 DIAGNOSIS — G4733 Obstructive sleep apnea (adult) (pediatric): Secondary | ICD-10-CM | POA: Diagnosis not present

## 2023-08-22 NOTE — Telephone Encounter (Signed)
Called patient to schedule OV.

## 2023-08-22 NOTE — Telephone Encounter (Signed)
 Requested Prescriptions  Pending Prescriptions Disp Refills   esomeprazole  (NEXIUM ) 20 MG capsule [Pharmacy Med Name: ESOMEPRAZOLE  MAG DR 20 MG CAP] 90 capsule 0    Sig: TAKE 1 CAPSULE BY MOUTH EVERY DAY BEFORE BREAKFAST     Gastroenterology: Proton Pump Inhibitors 2 Failed - 08/22/2023  8:53 AM      Failed - ALT in normal range and within 360 days    ALT  Date Value Ref Range Status  01/07/2023 31 (H) 6 - 29 U/L Final         Failed - Valid encounter within last 12 months    Recent Outpatient Visits           5 months ago Bronchitis   [place] [facility] [place], Marsa PARAS, DO       Future Appointments             In 1 week Ed[place] Marsa PARAS, DO [place] [facility]            Passed - AST in normal range and within 360 days    AST  Date Value Ref Range Status  01/07/2023 17 10 - 35 U/L Final

## 2023-08-24 DIAGNOSIS — D225 Melanocytic nevi of trunk: Secondary | ICD-10-CM | POA: Diagnosis not present

## 2023-08-24 DIAGNOSIS — D2271 Melanocytic nevi of right lower limb, including hip: Secondary | ICD-10-CM | POA: Diagnosis not present

## 2023-08-24 DIAGNOSIS — D2272 Melanocytic nevi of left lower limb, including hip: Secondary | ICD-10-CM | POA: Diagnosis not present

## 2023-08-24 DIAGNOSIS — L400 Psoriasis vulgaris: Secondary | ICD-10-CM | POA: Diagnosis not present

## 2023-08-24 DIAGNOSIS — D2262 Melanocytic nevi of left upper limb, including shoulder: Secondary | ICD-10-CM | POA: Diagnosis not present

## 2023-08-24 DIAGNOSIS — L821 Other seborrheic keratosis: Secondary | ICD-10-CM | POA: Diagnosis not present

## 2023-08-24 DIAGNOSIS — D2261 Melanocytic nevi of right upper limb, including shoulder: Secondary | ICD-10-CM | POA: Diagnosis not present

## 2023-08-24 DIAGNOSIS — L570 Actinic keratosis: Secondary | ICD-10-CM | POA: Diagnosis not present

## 2023-08-31 ENCOUNTER — Ambulatory Visit (INDEPENDENT_AMBULATORY_CARE_PROVIDER_SITE_OTHER): Payer: Self-pay | Admitting: Family Medicine

## 2023-08-31 ENCOUNTER — Encounter: Payer: Self-pay | Admitting: Family Medicine

## 2023-08-31 VITALS — BP 130/80 | HR 70 | Ht 69.0 in | Wt 252.5 lb

## 2023-08-31 DIAGNOSIS — G2581 Restless legs syndrome: Secondary | ICD-10-CM

## 2023-08-31 DIAGNOSIS — J3089 Other allergic rhinitis: Secondary | ICD-10-CM | POA: Diagnosis not present

## 2023-08-31 DIAGNOSIS — Z7985 Long-term (current) use of injectable non-insulin antidiabetic drugs: Secondary | ICD-10-CM | POA: Diagnosis not present

## 2023-08-31 DIAGNOSIS — E1169 Type 2 diabetes mellitus with other specified complication: Secondary | ICD-10-CM

## 2023-08-31 LAB — POCT GLYCOSYLATED HEMOGLOBIN (HGB A1C)
HbA1c POC (<> result, manual entry): 0 % (ref 4.0–5.6)
Hemoglobin A1C: 9.2 % — AB (ref 4.0–5.6)

## 2023-08-31 MED ORDER — GABAPENTIN 300 MG PO CAPS: 300.0000 mg | ORAL_CAPSULE | Freq: Every day | ORAL | 3 refills | Status: AC | PRN

## 2023-08-31 MED ORDER — FLUTICASONE PROPIONATE 50 MCG/ACT NA SUSP: 2.0000 | Freq: Every day | NASAL | 3 refills | Status: DC

## 2023-08-31 NOTE — Progress Notes (Signed)
 Subjective:    Patient ID: Hannah Cobb, female    DOB: 22-Nov-1960, 63 y.o.   MRN: [id number]  Hannah Cobb is a 63 y.o. female presenting on 08/31/2023 for Medical Management of Chronic Issues and Diabetes   HPI  Discussed the use of AI scribe software for clinical note transcription with the patient, who gave verbal consent to proceed.  History of Present Illness   Hannah Cobb is a 63 year old female who presents for follow-up of her diabetes management.   Restless legs syndrome - Uses gabapentin  300 mg as needed for severe symptoms - Prefers to maintain a 90-day supply for as-needed use  Rhinitis / Nasal congestion associated with cpap use - Uses fluticasone  nasal spray for nasal congestion related to CPAP machine - Requests prescription for fluticasone  nasal spray   CHRONIC DM, Type 2: - HbA1c improved from 10.5% to 9.2% since last visit Still improving diet. Now on Medicare, [employer] insurance. Past history prior ins no longer covered on Mounjaro . So she has done well with Trulicity  higher dose. Interested in further dose inc Trulicity  to 4.5 or switch back to Mounjaro  Meds: Trulicity  3mg  weekly inj, Metformin  XR 500mg  x 2 in AM + 1 in PM Reports good compliance. Tolerating well w/o side-effects Goal to improve lifestyle Needs DM Eye Exam updated at [facility] Denies hypoglycemia, polyuria, visual changes, numbness or tingling.      08/31/2023    2:42 PM 01/13/2023    8:47 AM 12/15/2022    4:18 PM  Depression screen PHQ 2/9  Decreased Interest 0 0 0  Down, Depressed, Hopeless 0 0 0  PHQ - 2 Score 0 0 0       08/31/2023    2:42 PM 01/13/2023    8:47 AM 12/15/2022    4:18 PM 10/11/2022    8:48 AM  GAD 7 : Generalized Anxiety Score  Nervous, Anxious, on Edge 0 0 0 0  Control/stop worrying 0 0 0 0  Worry too much - different things 0 0 0 0  Trouble relaxing 0 0 0 0  Restless 0 0 0 0  Easily annoyed or irritable 0 0 0 0  Afraid - awful might happen  0 0 0 0  Total GAD 7 Score 0 0 0 0  Anxiety Difficulty    Not difficult at all    Social History   Tobacco Use   Smoking status: Never   Smokeless tobacco: Never  Vaping Use   Vaping status: Never Used  Substance Use Topics   Alcohol use: Yes    Alcohol/week: 1.0 standard drink of alcohol    Types: 1 Standard drinks or equivalent per week    Comment: rare   Drug use: No    Review of Systems Per HPI unless specifically indicated above     Objective:    BP 130/80 (BP Location: Right Wrist, Patient Position: Sitting, Cuff Size: Large)   Pulse 70   Ht 5' 9 (1.753 m)   Wt 252 lb 8 oz (114.5 kg)   SpO2 98%   BMI 37.29 kg/m   Wt Readings from Last 3 Encounters:  08/31/23 252 lb 8 oz (114.5 kg)  03/01/23 261 lb (118.4 kg)  02/10/23 261 lb (118.4 kg)    Physical Exam Vitals and nursing note reviewed.  Constitutional:      General: She is not in acute distress.    Appearance: Normal appearance. She is well-developed. She is obese. She  is not diaphoretic.     Comments: Well-appearing, comfortable, cooperative  HENT:     Head: Normocephalic and atraumatic.  Eyes:     General:        Right eye: No discharge.        Left eye: No discharge.     Conjunctiva/sclera: Conjunctivae normal.  Cardiovascular:     Rate and Rhythm: Normal rate.  Pulmonary:     Effort: Pulmonary effort is normal.  Skin:    General: Skin is warm and dry.     Findings: No erythema or rash.  Neurological:     Mental Status: She is alert and oriented to person, place, and time.  Psychiatric:        Mood and Affect: Mood normal.        Behavior: Behavior normal.        Thought Content: Thought content normal.     Comments: Well groomed, good eye contact, normal speech and thoughts     Results for orders placed or performed in visit on 08/31/23  POCT HgB A1C   Collection Time: 08/31/23  2:49 PM  Result Value Ref Range   Hemoglobin A1C 9.2 (A) 4.0 - 5.6 %   HbA1c POC (<> result, manual entry)  . 4.0 - 5.6 %   HbA1c, POC (prediabetic range)     HbA1c, POC (controlled diabetic range)        Assessment & Plan:   Problem List Items Addressed This Visit     Allergic rhinitis   Relevant Medications   fluticasone  (FLONASE ) 50 MCG/ACT nasal spray   Diabetes mellitus (HCC) - Primary   Relevant Orders   POCT HgB A1C (Completed)   Morbid obesity (HCC)   Other Visit Diagnoses       Long-term current use of injectable noninsulin antidiabetic medication         Restless leg syndrome       Relevant Medications   gabapentin  (NEURONTIN ) 300 MG capsule        Type 2 diabetes mellitus Type 2 diabetes mellitus with improved glycemic control. Hemoglobin A1c decreased from 10.5% to 9.2%. Past history success on Mounjaro  but then had insurance change  - Coordinate with pharmacist [facility] for insurance coverage of Mounjaro  versus Trulicity  adjustment or PAP if available. - Increase Trulicity  dose to 4.5mg  if Mounjaro  not covered. - Continue Metformin  XR 500mg  x 3 per day - Monitor blood glucose levels and adjust treatment as needed. - Schedule follow-up in December to reassess A1c and treatment plan.  Restless legs syndrome Restless legs syndrome managed with gabapentin . Prefers as-needed use. - Prescribe gabapentin  300 mg as needed, 90-day supply with refills.  Allergic rhinitis Allergic rhinitis managed with fluticasone  nasal spray, especially due to CPAP use. - Prescribe fluticasone  nasal spray.      Referral to Sharyle Sia [facility] CPP through [facility] pharmacy program for DM management, med assistance, formulary updates, and also question on [employer] coverage / assistance in coordination with her Dermatologist Dr Dela.   Orders Placed This Encounter  Procedures   POCT HgB A1C    Meds ordered this encounter  Medications   gabapentin  (NEURONTIN ) 300 MG capsule    Sig: Take 1 capsule (300 mg total) by mouth daily as needed (restless leg).    Dispense:  90 capsule    Refill:  3    fluticasone  (FLONASE ) 50 MCG/ACT nasal spray    Sig: Place 2 sprays into both nostrils daily. Use for 4-6 weeks then stop  and use seasonally or as needed.    Dispense:  16 g    Refill:  3    Follow up plan: Return in about 4 months (around 12/31/2023) for 4 month DM A1c.   Marsa Officer, DO [facility] 08/31/2023, 2:55 PM

## 2023-08-31 NOTE — Patient Instructions (Addendum)
 Thank you for coming to the office today.  Recent Labs    01/07/23 0812 08/31/23 1449  HGBA1C 10.5* .  9.2*   Stay tuned for call from [facility] on the Trulicity  vs Mounjaro  discussion, we will see the coverage and best options for you.  Please schedule a Follow-up Appointment to: Return in about 4 months (around 12/31/2023) for 4 month DM A1c.  If you have any other questions or concerns, please feel free to call the office or send a message through MyChart. You may also schedule an earlier appointment if necessary.  Additionally, you may be receiving a survey about your experience at our office within a few days to 1 week by e-mail or mail. We value your feedback.  Marsa Officer, DO [facility], [place]

## 2023-09-01 ENCOUNTER — Telehealth: Payer: Self-pay

## 2023-09-01 NOTE — Progress Notes (Signed)
 Care Guide Pharmacy Note  09/01/2023 Name: Hannah Cobb MRN: [id number] DOB: 1960/06/23  Referred By: Edman Marsa PARAS, DO Reason for referral: Complex Care Management (Outreach to schedule with Pharm d )   Hannah Cobb is a 63 y.o. year old female who is a primary care patient of Edman Marsa PARAS, DO.  Hannah Cobb was referred to the pharmacist for assistance related to: DMII  Successful contact was made with the patient to discuss pharmacy services including being ready for the pharmacist to call at least 5 minutes before the scheduled appointment time and to have medication bottles and any blood pressure readings ready for review. The patient agreed to meet with the pharmacist via telephone visit on (date/time).09/02/2023  Jeoffrey Buffalo , RMA     [place]  [facility], [facility] Guide  Direct Dial: [phone]  Website: [email or web address]

## 2023-09-02 ENCOUNTER — Other Ambulatory Visit (INDEPENDENT_AMBULATORY_CARE_PROVIDER_SITE_OTHER): Payer: Self-pay | Admitting: Pharmacist

## 2023-09-02 ENCOUNTER — Encounter: Payer: Self-pay | Admitting: Pharmacist

## 2023-09-02 ENCOUNTER — Other Ambulatory Visit: Payer: Self-pay | Admitting: Family Medicine

## 2023-09-02 DIAGNOSIS — E1169 Type 2 diabetes mellitus with other specified complication: Secondary | ICD-10-CM

## 2023-09-02 DIAGNOSIS — Z7985 Long-term (current) use of injectable non-insulin antidiabetic drugs: Secondary | ICD-10-CM

## 2023-09-02 MED ORDER — MOUNJARO 5 MG/0.5ML ~~LOC~~ SOAJ
5.0000 mg | SUBCUTANEOUS | 1 refills | Status: DC
Start: 2023-09-02 — End: 2023-10-12

## 2023-09-02 MED ORDER — FREESTYLE LIBRE 3 PLUS SENSOR MISC: 12 refills | Status: AC

## 2023-09-02 NOTE — Patient Instructions (Signed)
 Our plan is to switch from Trulicity  to Mounjaro . Once you use up your current supply of Trulicity , you will start Mounjaro  1 week later. Your Mounjaro  dose will be 5 mg once weekly.   Like Trulicity , Mounjaro  may cause stomach upset, queasiness, or constipation, especially when first starting. This generally improves over time. Call our office if these symptoms occur and worsen, or if you have severe symptoms such as vomiting, diarrhea, or stomach pain.      Freestyle Libre Continuous Glucose Monitoring    Please copy and paste the following web address into Training and development officer for the [employer] 3 Videos:   [email or web address].abbott/us -en/how-to-set-up.[email or web address]   As we discussed, you will need to download the Freestyle Herlene App to your smart phone in order to use your phone in place of the reader device   Please remember that you will need to apply a new Freestyle Libre 3 Plus sensor every 15 days   Also, you can reach out to the MyFreeStyle Support at [phone]. Their customer support is available 7 days a week 8 am to 8 pm.   Please review the video above and call me, your provider or the pharmacy with any questions before starting to use this device.     Meal Planning Handouts/Links   As we discussed, I will ask Dr. Edman about a referral for you to the dietitian   [place] [facility] [address] [place],  [place]  [phone]   Also, the following his web address for the handout on meal planning that we discussed:   ToyProtection.[email or web address]   As well as a website with some recipe ideas:   [email or web address]     Thank you!   Sharyle Sia, PharmD, [facility] [phone]

## 2023-09-02 NOTE — Progress Notes (Signed)
 09/02/2023 Name: Hannah Cobb MRN: [id number] DOB: 08-30-1960  Chief Complaint  Patient presents with   Medication Management   Medication Assistance    Hannah Cobb is a 63 y.o. year old female who presented for a telephone visit.   They were referred to the pharmacist by their PCP for assistance in managing diabetes and medication access.    Subjective:  Care Team: Primary Care Provider: Edman Marsa PARAS, DO ; Next Scheduled Visit: 12/21/2023 Dermatologist: Dr Dela at [facility]  Medication Access/Adherence  Current Pharmacy:  [id number] - [place], [place] - [address] [place] [address] Phone: [phone] Fax: [phone]   Patient reports affordability concerns with their medications: Yes  Patient reports access/transportation concerns to their pharmacy: No  Patient reports adherence concerns with their medications:  No    Uses weekly pillbox to organize her medications  Patient reports also collaborating with her Dermatologist regarding affordability options for her Taltz  Diabetes:  Current medications:  - Metformin  ER 500 mg -2 in morning and 1 in PM - Trulicity  3 mg weekly on Tuesdays  Reports has 3 doses remaining; would like to use up this supply prior to switching to Mounjaro   Medications tried in the past: Mounjaro  (cost)  Denies currently checking home blood sugar; does not want to fingerstick check   Patient denies hypoglycemic s/sx including dizziness, shakiness, sweating.   Patient requesting to switch from Trulicity  to Mounjaro  if affordable through her new [employer] prescription plan. Reports has tolerated both Trulicity , and previously Mounjaro , well and would like to restart Mounjaro  5 mg weekly  Current physical activity: reports currently limited; currently getting ~4000 steps/day. Planning to increase walking and restart water aerobics    Objective:  Lab Results  Component  Value Date   HGBA1C 9.2 (A) 08/31/2023   HGBA1C . 08/31/2023    Lab Results  Component Value Date   CREATININE 0.76 01/07/2023   BUN 15 01/07/2023   NA 134 (L) 01/07/2023   K 4.6 01/07/2023   CL 97 (L) 01/07/2023   CO2 27 01/07/2023    Lab Results  Component Value Date   CHOL 171 01/07/2023   HDL 40 (L) 01/07/2023   LDLCALC 99 01/07/2023   TRIG 205 (H) 01/07/2023   CHOLHDL 4.3 01/07/2023    Medications Reviewed Today     Reviewed by Alana Sharyle LABOR, RPH-CPP (Pharmacist) on 09/02/23 at 1336  Med List Status: <None>   Medication Order Taking? Sig Documenting Provider Last Dose Status Informant    Discontinued 09/02/23 0910   Cholecalciferol 125 MCG (5000 UT) TABS [id number] Yes Take 1 tablet by mouth daily. [provider]  Active   clobetasol (TEMOVATE) 0.05 % external solution [id number] Yes Apply topically. [provider]  Active   clorazepate  (TRANXENE ) 7.5 MG tablet [id number] Yes Take 1-2 tablets (7.5-15 mg total) by mouth 2 (two) times daily as needed for anxiety. Edman Marsa PARAS, DO  Active   Continuous Glucose Sensor (FREESTYLE LIBRE 3 PLUS SENSOR) [place] [id number] Yes Place 1 sensor on the skin every 15 days. Use to check glucose continuously Edman Marsa PARAS, DO  Active   docusate sodium (COLACE) 100 MG capsule [id number] Yes Take 100 mg by mouth daily.  Patient taking differently: Take 200 mg by mouth daily.   [provider]  Active   esomeprazole  (NEXIUM ) 20 MG capsule [id number] Yes TAKE 1 CAPSULE BY MOUTH EVERY DAY BEFORE BREAKFAST Karamalegos, Marsa PARAS, DO  Active   fluticasone  (FLONASE ) 50 MCG/ACT nasal spray [id number] Yes Place 2 sprays into both nostrils daily. Use for 4-6 weeks then stop and use seasonally or as needed. Edman Marsa PARAS, DO  Active   gabapentin  (NEURONTIN ) 300 MG capsule [id number] Yes Take 1 capsule (300 mg total) by mouth daily as needed (restless leg). Edman Marsa PARAS, DO  Active    Ixekizumab (TALTZ [place]) [id number] Yes Inject into the skin. [provider]  Active   metFORMIN  (GLUCOPHAGE -XR) 500 MG 24 hr tablet [id number] Yes Take 2 tablets in morning with meal and take 1 tablet in evening with meal. Edman Marsa PARAS, DO  Active   tirzepatide  (MOUNJARO ) 5 MG/0.5ML Pen [id number] Yes Inject 5 mg into the skin once a week. Edman Marsa PARAS, DO  Active    Discontinued 09/02/23 1135 (Change in therapy)               Assessment/Plan:   Counsel patient on [employer] patient assistance program for [employer]. States that she will follow up with her Dermatologist to discuss further  Diabetes: - Currently uncontrolled - Reviewed long term cardiovascular and renal outcomes of uncontrolled blood sugar - Reviewed goal A1c, goal fasting, and goal 2 hour post prandial glucose - Reviewed dietary modifications including importance of having regular well-balanced meals and snacks throughout the day, while controlling carbohydrate portion sizes             Encourage patient to avoid consumption of sugary beverages  Reports planning to work on increasing water intake - Will collaborate with PCP to request provider send referral for dietitian for patient - Submit prior authorization request for Mounjaro  via covermymeds today and PA approved through 09/01/2024 CPP sends prescription for Mounjaro  5 mg once weekly to pharmacy for patient. Patient verbalizes understanding of plan to start Mounjaro  one week after finished up current supply of Trulicity  - Provide patient with counseling on Freestyle Libre 3 Plus CGM.  Patient plans to download and use Freestyle Libre 3 app on smart phone to use phone in place of reader device Advise patient to review Freestyle Libre 3 setup videos from manufacturer website and reach out to clinical pharmacist, pharmacy or office with any questions prior to starting use of this device Provide patient with phone number for MyFreeStyle Support at  [phone]. Their customer support is available 7 days a week 8 am to 8 pm - Patient denies personal or family history of multiple endocrine neoplasia type 2, medullary thyroid cancer; personal history of pancreatitis or gallbladder disease. - Recommend to start Freestyle Libre 3 CGM to monitor blood sugar/as feedback on dietary choices Patient to contact office if needed for readings outside of established parameters or symptoms    Follow Up Plan: Clinical Pharmacist will follow up with patient by telephone on 10/12/2023 at 9:30 AM   Sharyle Sia, PharmD, [facility] [phone]

## 2023-09-19 ENCOUNTER — Other Ambulatory Visit: Payer: Self-pay | Admitting: Pharmacist

## 2023-09-19 ENCOUNTER — Encounter: Payer: Self-pay | Admitting: Pharmacist

## 2023-09-19 DIAGNOSIS — Z7985 Long-term (current) use of injectable non-insulin antidiabetic drugs: Secondary | ICD-10-CM

## 2023-09-19 DIAGNOSIS — E1169 Type 2 diabetes mellitus with other specified complication: Secondary | ICD-10-CM

## 2023-09-19 NOTE — Progress Notes (Signed)
   09/19/2023  Patient ID: Odella GORMAN Sink, female   DOB: [birth year], 63 y.o.   MRN: [id number]  Receive a call from patient requesting a call back regarding her Freestyle Libre continuous glucose monitor.  Return call to patient. Reports that her first Freestyle Libre 3 Plus sensor fell off after ~7 days. Note patient has a second sensor from the 1 month supply that she picked up from pharmacy.  Counsel patient on CGM sensor application and techniques to aid with adhesion. Also send patient link to [employer] & Adhesion Guide via [employer].  Provide patient with contact information for [facility] and encourage her to call to request a replacement sensor.   Follow Up Plan: Clinical Pharmacist will follow up with patient by telephone on 10/12/2023 at 9:30 AM    Sharyle Sia, PharmD, [facility] [phone]

## 2023-09-19 NOTE — Patient Instructions (Signed)
 The following is the web address for that handout on Sensor Adhesion that we discussed:   https://www.freestyle.abbott/content/dam/adc/freestyle/countries/[place] -en/documents/support-page/freestyle-libre-sensor-[email or web address].[id number]     Please copy and paste the entire web address above and paste into your internet browser to review.   Also you can reach out to the MyFree[employer] Support at [phone]. Their customer support is available 7 days a week 8 am to 8 pm.   Thank you!   Sharyle Sia, PharmD, JAQUELINE, CPP Clinical Pharmacist [facility] [phone]

## 2023-09-30 DIAGNOSIS — M9903 Segmental and somatic dysfunction of lumbar region: Secondary | ICD-10-CM | POA: Diagnosis not present

## 2023-09-30 DIAGNOSIS — M9901 Segmental and somatic dysfunction of cervical region: Secondary | ICD-10-CM | POA: Diagnosis not present

## 2023-09-30 DIAGNOSIS — M542 Cervicalgia: Secondary | ICD-10-CM | POA: Diagnosis not present

## 2023-09-30 DIAGNOSIS — M545 Low back pain, unspecified: Secondary | ICD-10-CM | POA: Diagnosis not present

## 2023-10-12 ENCOUNTER — Other Ambulatory Visit (INDEPENDENT_AMBULATORY_CARE_PROVIDER_SITE_OTHER): Admitting: Pharmacist

## 2023-10-12 DIAGNOSIS — E1169 Type 2 diabetes mellitus with other specified complication: Secondary | ICD-10-CM

## 2023-10-12 DIAGNOSIS — Z7985 Long-term (current) use of injectable non-insulin antidiabetic drugs: Secondary | ICD-10-CM

## 2023-10-12 MED ORDER — MOUNJARO 7.5 MG/0.5ML ~~LOC~~ SOAJ: 7.5000 mg | SUBCUTANEOUS | 2 refills | Status: DC

## 2023-10-12 NOTE — Patient Instructions (Signed)
 Goals Addressed             This Visit's Progress    Pharmacy Goals       The goal A1c is less than 7%. This is the best way to reduce the risk of the long term complications of diabetes, including heart disease, kidney disease, eye disease, strokes, and nerve damage. An A1c of less than 7% corresponds with fasting sugars less than 130 and 2 hour after meal sugars less than 180.  You can reach MyFreeStyle Support at [phone]. Their customer support is available 7 days a week 8 am to 8 pm.   Thank you!   Sharyle Sia, PharmD, JAQUELINE, CPP Clinical Pharmacist [facility] [phone]

## 2023-10-12 NOTE — Progress Notes (Signed)
 10/12/2023 Name: Hannah Cobb MRN: [id number] DOB: Nov 22, 1960  Chief Complaint  Patient presents with   Medication Management   Medication Assistance    Hannah Cobb is a 63 y.o. year old female who presented for a telephone visit.   They were referred to the pharmacist by their PCP for assistance in managing diabetes and medication access.      Subjective:   Care Team: Primary Care Provider: Edman Marsa PARAS, DO ; Next Scheduled Visit: 12/21/2023 Dermatologist: Dr Dela at [facility] Dietitian: Katheleen Aleene ORN, RD; Initial Scheduled Visit: 10/18/2023  Medication Access/Adherence  Current Pharmacy:  [id number] - [place], [place] - [address] [place] [address] Phone: [phone] Fax: [phone]   Patient reports affordability concerns with their medications: No  Patient reports access/transportation concerns to their pharmacy: No  Patient reports adherence concerns with their medications:  No     Uses weekly pillbox to organize her medications   Reports working on reducing esomeprazole  use as discussed to only use as needed to control acid symptoms  Diabetes:   Current medications:  - Metformin  ER 500 mg -2 in morning and 1 in PM - Mounjaro  5 mg once weekly on Sundays (Switched from Trulicity  to Mounjaro  on 09/25/2023) Reports tolerating well and has noticed slight improvement in appetite since switched to Mounjaro . Requests to increase Mounjaro  dose for more appetite benefit   Medications tried in the past: Mounjaro  (cost)   Patient using Freestyle Libre 3 Plus continuous glucose monitor. Using App on phone in place of reader device  Date of Download: 10/12/23 % Time CGM is active: 96% Average Glucose: 158 mg/dL Glucose Management Indicator: 7.1%  Glucose Variability: 16.1% (goal <36%) Time in Goal:  - Time in range 70-180: 82% - Time above range: 18% - Time below range: 0%         Reports using CGM as  feedback on dietary choices/to guide dietary changes. Patient also now scheduled for initial appointment with dietitian  Patient denies hypoglycemic s/sx including dizziness, shakiness, sweating.      Current physical activity: reports currently limited to walking around her home. Planning to increase walking and restart water aerobics   Objective:  Lab Results  Component Value Date   HGBA1C 9.2 (A) 08/31/2023   HGBA1C . 08/31/2023    Lab Results  Component Value Date   CREATININE 0.76 01/07/2023   BUN 15 01/07/2023   NA 134 (L) 01/07/2023   K 4.6 01/07/2023   CL 97 (L) 01/07/2023   CO2 27 01/07/2023    Lab Results  Component Value Date   CHOL 171 01/07/2023   HDL 40 (L) 01/07/2023   LDLCALC 99 01/07/2023   TRIG 205 (H) 01/07/2023   CHOLHDL 4.3 01/07/2023    Medications Reviewed Today     Reviewed by Alana Sharyle LABOR, RPH-CPP (Pharmacist) on 10/12/23 at 1017  Med List Status: <None>   Medication Order Taking? Sig Documenting Provider Last Dose Status Informant  Cholecalciferol 125 MCG (5000 UT) TABS [id number]  Take 1 tablet by mouth daily. [provider]  Active   clobetasol (TEMOVATE) 0.05 % external solution [id number]  Apply topically. [provider]  Active   clorazepate  (TRANXENE ) 7.5 MG tablet [id number]  Take 1-2 tablets (7.5-15 mg total) by mouth 2 (two) times daily as needed for anxiety. Edman Marsa PARAS, DO  Active   Continuous Glucose Sensor (FREESTYLE LIBRE 3 PLUS SENSOR) [place] [id number]  Place 1 sensor  on the skin every 15 days. Use to check glucose continuously Edman Marsa PARAS, DO  Active   docusate sodium (COLACE) 100 MG capsule [id number]  Take 100 mg by mouth daily.  Patient taking differently: Take 200 mg by mouth daily.   [provider]  Active   esomeprazole  (NEXIUM ) 20 MG capsule [id number] Yes TAKE 1 CAPSULE BY MOUTH EVERY DAY BEFORE BREAKFAST  Patient taking differently: Taking 5 days/week    Edman Marsa PARAS, DO  Active   fluticasone  (FLONASE ) 50 MCG/ACT nasal spray [id number]  Place 2 sprays into both nostrils daily. Use for 4-6 weeks then stop and use seasonally or as needed. Edman Marsa PARAS, DO  Active   gabapentin  (NEURONTIN ) 300 MG capsule [id number]  Take 1 capsule (300 mg total) by mouth daily as needed (restless leg). Edman Marsa PARAS, DO  Active   Ixekizumab (TALTZ [place]) [id number]  Inject into the skin. [provider]  Active   metFORMIN  (GLUCOPHAGE -XR) 500 MG 24 hr tablet [id number] Yes Take 2 tablets in morning with meal and take 1 tablet in evening with meal. Edman Marsa PARAS, DO  Active     Discontinued 10/12/23 1014 (Change in therapy)   tirzepatide  (MOUNJARO ) 7.5 MG/0.5ML Pen [id number] Yes Inject 7.5 mg into the skin once a week. Edman Marsa PARAS, DO  Active               Assessment/Plan:   Diabetes: - Currently uncontrolled - Reviewed long term cardiovascular and renal outcomes of uncontrolled blood sugar - Reviewed goal A1c, goal fasting, and goal 2 hour post prandial glucose - Reviewed dietary modifications including importance of having regular well-balanced meals and snacks throughout the day, while controlling carbohydrate portion sizes Extensive dietary counseling including education on focus on lean proteins, fruits and vegetables, whole grains and increased fiber consumption, adequate hydration             Reports planning to work on increasing water intake - Again provide patient with phone number for My[employer] Support at [phone]. Their customer support is available 7 days a week 8 am to 8 pm - As requested, advise patient may increase Mounjaro  dose to 7.5 mg weekly (after completing 4 weeks of Mounjaro  5 mg weekly dose).   CPP sends prescription to pharmacy for patient - Recommend to continue to use Freestyle Libre 3 CGM to monitor blood sugar/as feedback on dietary choices Recommend to check  glucose with fingerstick check when needed for symptoms and as back up to CGM.  Patient to contact office if needed for readings outside of established parameters or symptoms      Follow Up Plan: Clinical Pharmacist will follow up with patient by telephone on 02/08/2024 at 9:30 AM     Sharyle Sia, PharmD, [facility] [phone]

## 2023-10-18 ENCOUNTER — Ambulatory Visit: Admitting: Dietician

## 2023-11-08 ENCOUNTER — Other Ambulatory Visit: Payer: Self-pay | Admitting: Family Medicine

## 2023-11-08 DIAGNOSIS — K219 Gastro-esophageal reflux disease without esophagitis: Secondary | ICD-10-CM

## 2023-11-10 NOTE — Telephone Encounter (Signed)
 Requested Prescriptions  Pending Prescriptions Disp Refills   esomeprazole  (NEXIUM ) 20 MG capsule [Pharmacy Med Name: ESOMEPRAZOLE  MAG DR 20 MG CAP] 90 capsule 0    Sig: TAKE 1 CAPSULE BY MOUTH EVERY DAY BEFORE BREAKFAST     Gastroenterology: Proton Pump Inhibitors 2 Failed - 11/10/2023  1:29 PM      Failed - ALT in normal range and within 360 days    ALT  Date Value Ref Range Status  01/07/2023 31 (H) 6 - 29 U/L Final         Passed - AST in normal range and within 360 days    AST  Date Value Ref Range Status  01/07/2023 17 10 - 35 U/L Final         Passed - Valid encounter within last 12 months    Recent Outpatient Visits           2 months ago Type 2 diabetes mellitus with other specified complication, without long-term current use of insulin [facility])   [place] [facility] [place], Marsa PARAS, DO   8 months ago Bronchitis   [place] [facility] [place], Marsa PARAS, [place]

## 2023-11-12 DIAGNOSIS — M9901 Segmental and somatic dysfunction of cervical region: Secondary | ICD-10-CM | POA: Diagnosis not present

## 2023-11-12 DIAGNOSIS — M9903 Segmental and somatic dysfunction of lumbar region: Secondary | ICD-10-CM | POA: Diagnosis not present

## 2023-11-12 DIAGNOSIS — M542 Cervicalgia: Secondary | ICD-10-CM | POA: Diagnosis not present

## 2023-11-12 DIAGNOSIS — M545 Low back pain, unspecified: Secondary | ICD-10-CM | POA: Diagnosis not present

## 2023-11-18 ENCOUNTER — Other Ambulatory Visit: Payer: Self-pay | Admitting: Family Medicine

## 2023-11-18 DIAGNOSIS — E1169 Type 2 diabetes mellitus with other specified complication: Secondary | ICD-10-CM

## 2023-11-19 NOTE — Telephone Encounter (Signed)
 Requested Prescriptions  Pending Prescriptions Disp Refills   metFORMIN  (GLUCOPHAGE -XR) 500 MG 24 hr tablet [Pharmacy Med Name: METFORMIN  HCL ER 500 MG TABLET] 270 tablet 0    Sig: TAKE 2 TABLETS IN MORNING WITH MEAL AND TAKE 1 TABLET IN EVENING WITH MEAL.     Endocrinology:  Diabetes - Biguanides Failed - 11/19/2023 11:47 AM      Failed - HBA1C is between 0 and 7.9 and within 180 days    Hemoglobin A1C  Date Value Ref Range Status  08/31/2023 9.2 (A) 4.0 - 5.6 % Final   HbA1c POC (<> result, manual entry)  Date Value Ref Range Status  08/31/2023 . 4.0 - 5.6 % Final         Failed - B12 Level in normal range and within 720 days    No results found for: VITAMINB12       Passed - Cr in normal range and within 360 days    Creat  Date Value Ref Range Status  01/07/2023 0.76 0.50 - 1.05 mg/dL Final   Creatinine, Urine  Date Value Ref Range Status  10/11/2022 187 20 - 275 mg/dL Final         Passed - eGFR in normal range and within 360 days    GFR calc Af Amer  Date Value Ref Range Status  12/22/2017 >60 >60 mL/min Final   GFR calc non Af Amer  Date Value Ref Range Status  12/22/2017 >60 >60 mL/min Final   eGFR  Date Value Ref Range Status  01/07/2023 87 > OR = 60 mL/min/1.17m2 Final         Passed - Valid encounter within last 6 months    Recent Outpatient Visits           2 months ago Type 2 diabetes mellitus with other specified complication, without long-term current use of insulin (HCC)   [place] [facility] Edman Marsa PARAS, DO   8 months ago Bronchitis   [place] [facility] [place], Marsa PARAS, DO              Passed - CBC within normal limits and completed in the last 12 months    WBC  Date Value Ref Range Status  01/07/2023 6.5 3.8 - 10.8 Thousand/uL Final   RBC  Date Value Ref Range Status  01/07/2023 4.28 3.80 - 5.10 Million/uL Final   Hemoglobin  Date Value Ref Range Status  01/07/2023  12.3 11.7 - 15.5 g/dL Final   HCT  Date Value Ref Range Status  01/07/2023 38.0 35.0 - 45.0 % Final   MCHC  Date Value Ref Range Status  01/07/2023 32.4 32.0 - 36.0 g/dL Final    Comment:    For adults, a slight decrease in the calculated MCHC value (in the range of 30 to 32 g/dL) is most likely not clinically significant; however, it should be interpreted with caution in correlation with other red cell parameters and the patient's clinical condition.    [facility]  Date Value Ref Range Status  01/07/2023 28.7 27.0 - 33.0 pg Final   MCV  Date Value Ref Range Status  01/07/2023 88.8 80.0 - 100.0 fL Final   No results found for: PLTCOUNTKUC, LABPLAT, POCPLA RDW  Date Value Ref Range Status  01/07/2023 11.8 11.0 - 15.0 % Final

## 2023-11-23 ENCOUNTER — Encounter: Payer: Self-pay | Admitting: Dietician

## 2023-11-23 ENCOUNTER — Encounter: Attending: Family Medicine | Admitting: Dietician

## 2023-11-23 DIAGNOSIS — E1169 Type 2 diabetes mellitus with other specified complication: Secondary | ICD-10-CM | POA: Diagnosis not present

## 2023-11-23 DIAGNOSIS — Z713 Dietary counseling and surveillance: Secondary | ICD-10-CM | POA: Insufficient documentation

## 2023-11-23 DIAGNOSIS — E119 Type 2 diabetes mellitus without complications: Secondary | ICD-10-CM

## 2023-11-23 NOTE — Patient Instructions (Addendum)
 Desired Ranges on GCM Report: Very Low (below 54 mg/dL): <8% Low (below 70 mg/dL): <5% Time in Range (70 - 180 mg/dL): >29% High (819 - 749 mg/dL): <74% Very High (above 250 mg/dL): <4%  Work towards eating three meals a day, about 4-6 hours apart!  Begin to recognize carbohydrates, proteins, and non-starchy vegetables in your food choices!  Begin to build your meals using the proportions of the Balanced Plate. First, select your carb choice(s) for the meal. Make this 25% of your meal. Next, select your source of protein to pair with your carb choice(s). Make this another 25% of your meal. Finally, complete your meal with a variety of non-starchy vegetables. Make this the remaining 50% of your meal.  Increase your daily step count to 7,500 per day!

## 2023-11-23 NOTE — Progress Notes (Signed)
 Diabetes Self-Management Education  Visit Type: First/Initial  Appt. Start Time: 1120 Appt. End Time: 1245  11/23/2023  Hannah Cobb, identified by name and date of birth, is a 63 y.o. female with a diagnosis of Diabetes: Type 2.   ASSESSMENT Pt reports taking Mounjaro  @7 .5 mg, Metformin  @500  twice in AM, once in the evening. Pt reports suppressed appetite when increasing to 7.5 mg dose, reports regular constipation treated with Miralax and fiber supplements. Pt reports 12 lb weight loss in 2 months since starting Mounjaro  Pt reports using Freestyle LibrePLUS, states its very helpful because they would not prick their finger for glucometer. CGM Results from download:   Average glucose:   148 mg/dL for 73 days  Glucose management indicator:   6.9 %  Time in range (70-180 mg/dL):   90 %   (Goal >29%)  Time High (181-250 mg/dL):   10 %   (Goal < 74%)  Time Very High (>250 mg/dL):    0 %   (Goal < 5%)  Time Low (54-69 mg/dL):   0 %   (Goal <5%)  Time Very Low (<54 mg/dL):   0 %   (Goal <8%)  Pt reports husband recently had TKR surgery and they are spending most of their day caring for them, states this makes exercise unlikely. Pt states that they previously did water aerobics with [employer] prior to husbands surgery 2-3 times a week, would like to restart this.   Diabetes Self-Management Education - 11/23/23 1139       Visit Information   Visit Type First/Initial      Initial Visit   Diabetes Type Type 2    Date Diagnosed 2024    Are you currently following a meal plan? No    Are you taking your medications as prescribed? Yes      Health Coping   How would you rate your overall health? Good      Psychosocial Assessment   Patient Belief/Attitude about Diabetes Motivated to manage diabetes    What is the hardest part about your diabetes right now, causing you the most concern, or is the most worrisome to you about your diabetes?   Being active    Self-care barriers  None    Self-management support Doctor's office    Other persons present Patient    Patient Concerns Glycemic Control;Healthy Lifestyle;Nutrition/Meal planning;Weight Control    Special Needs None    Preferred Learning Style Hands on    Learning Readiness Ready    How often do you need to have someone help you when you read instructions, pamphlets, or other written materials from your doctor or pharmacy? 1 - Never    What is the last grade level you completed in school? 12th      Pre-Education Assessment   Patient understands the diabetes disease and treatment process. Needs Instruction    Patient understands incorporating nutritional management into lifestyle. Needs Instruction    Patient undertands incorporating physical activity into lifestyle. Needs Instruction    Patient understands using medications safely. Needs Instruction    Patient understands monitoring blood glucose, interpreting and using results Needs Instruction    Patient understands prevention, detection, and treatment of acute complications. Needs Instruction    Patient understands prevention, detection, and treatment of chronic complications. Needs Instruction    Patient understands how to develop strategies to address psychosocial issues. Needs Instruction    Patient understands how to develop strategies to promote health/change behavior. Needs Instruction  Complications   Last HgB A1C per patient/outside source 9.2 %   08/31/2023   How often do you check your blood sugar? > 4 times/day    Fasting Blood glucose range (mg/dL) 869-820    Postprandial Blood glucose range (mg/dL) 819-799;869-820    Have you had a dilated eye exam in the past 12 months? Yes    Have you had a dental exam in the past 12 months? Yes    Are you checking your feet? Yes    How many days per week are you checking your feet? 1      Dietary Intake   Breakfast 1 biscuit with chipped beef gravy, 2 cups of coffee w/ ZERO sugar pumpkin spice     Lunch Double stack burger from [employer] beans with queso and sour cream    Beverage(s) Water, decaf unsweet tea, Sprite ZERO      Activity / Exercise   Activity / Exercise Type ADL's    How many days per week do you exercise? 0    How many minutes per day do you exercise? 0    Total minutes per week of exercise 0      Patient Education   Previous Diabetes Education No    Disease Pathophysiology Factors that contribute to the development of diabetes;Explored patient's options for treatment of their diabetes    Healthy Eating Role of diet in the treatment of diabetes and the relationship between the three main macronutrients and blood glucose level;Plate Method;Meal timing in regards to the patients' current diabetes medication.;Meal options for control of blood glucose level and chronic complications.    Being Active Role of exercise on diabetes management, blood pressure control and cardiac health.;Helped patient identify appropriate exercises in relation to his/her diabetes, diabetes complications and other health issue.    Medications Reviewed patients medication for diabetes, action, purpose, timing of dose and side effects.    Monitoring Taught/evaluated CGM (comment)    Chronic complications Relationship between chronic complications and blood glucose control;Lipid levels, blood glucose control and heart disease    Diabetes Stress and Support Identified and addressed patients feelings and concerns about diabetes;Worked with patient to identify barriers to care and solutions;Role of stress on diabetes;Helped patient identify a support system for diabetes management    Lifestyle and Health Coping Lifestyle issues that need to be addressed for better diabetes care      Individualized Goals (developed by patient)   Nutrition Follow meal plan discussed    Physical Activity Exercise 3-5 times per week    Medications take my medication as prescribed    Monitoring  Consistenly  use CGM    Problem Solving Eating Pattern    Reducing Risk examine blood glucose patterns    Health Coping Ask for help with psychological, social, or emotional issues      Post-Education Assessment   Patient understands the diabetes disease and treatment process. Comprehends key points    Patient understands incorporating nutritional management into lifestyle. Comprehends key points    Patient undertands incorporating physical activity into lifestyle. Comprehends key points    Patient understands using medications safely. Comphrehends key points    Patient understands monitoring blood glucose, interpreting and using results Comprehends key points    Patient understands prevention, detection, and treatment of acute complications. Comprehends key points    Patient understands prevention, detection, and treatment of chronic complications. Comprehends key points    Patient understands how to develop strategies to  address psychosocial issues. Comprehends key points    Patient understands how to develop strategies to promote health/change behavior. Comprehends key points      Outcomes   Expected Outcomes Demonstrated interest in learning. Expect positive outcomes    Future DMSE 2 months    Program Status Not Completed          Individualized Plan for Diabetes Self-Management Training:   Learning Objective:  Patient will have a greater understanding of diabetes self-management. Patient education plan is to attend individual and/or group sessions per assessed needs and concerns.   Plan:   Patient Instructions  Desired Ranges on GCM Report: Very Low (below 54 mg/dL): <8% Low (below 70 mg/dL): <5% Time in Range (70 - 180 mg/dL): >29% High (819 - 749 mg/dL): <74% Very High (above 250 mg/dL): <4%  Work towards eating three meals a day, about 4-6 hours apart!  Begin to recognize carbohydrates, proteins, and non-starchy vegetables in your food choices!  Begin to build your meals using  the proportions of the Balanced Plate. First, select your carb choice(s) for the meal. Make this 25% of your meal. Next, select your source of protein to pair with your carb choice(s). Make this another 25% of your meal. Finally, complete your meal with a variety of non-starchy vegetables. Make this the remaining 50% of your meal.  Increase your daily step count to 7,500 per day!  Expected Outcomes:  Demonstrated interest in learning. Expect positive outcomes  Education material provided: My Plate, ADA Seated Exercises, CGM Overview  If problems or questions, patient to contact team via:  Phone and Email  Future DSME appointment: 2 months

## 2023-11-24 DIAGNOSIS — G4733 Obstructive sleep apnea (adult) (pediatric): Secondary | ICD-10-CM | POA: Diagnosis not present

## 2023-12-02 ENCOUNTER — Other Ambulatory Visit: Payer: Self-pay | Admitting: Family Medicine

## 2023-12-02 DIAGNOSIS — J3089 Other allergic rhinitis: Secondary | ICD-10-CM

## 2023-12-05 ENCOUNTER — Encounter: Payer: Self-pay | Admitting: Pharmacist

## 2023-12-05 NOTE — Progress Notes (Signed)
   12/05/2023  Patient ID: Hannah Cobb, female   DOB: 09-08-1960, 63 y.o.   MRN: [id number]  This patient is appearing on a report for being at risk of failing the adherence measure for diabetes medications this calendar year.   Medication: metformin  ER 500 mg Last fill date: 11/19/2023 for 90 day supply  Medication: Mounjaro  7.5 mg Last fill date: 11/13/2023 for 28 day supply  Insurance report was not up to date. No action needed at this time.   Sharyle Sia, PharmD, [facility] [phone]

## 2023-12-06 NOTE — Telephone Encounter (Signed)
 Requested Prescriptions  Pending Prescriptions Disp Refills   fluticasone  (FLONASE ) 50 MCG/ACT nasal spray [Pharmacy Med Name: FLUTICASONE  PROP 50 MCG SPRAY] 48 mL 1    Sig: PLACE 2 SPRAYS INTO BOTH NOSTRILS DAILY. USE FOR 4-6 WEEKS THEN STOP AND USE SEASONALLY OR AS NEEDED.     Ear, Nose, and Throat: Nasal Preparations - Corticosteroids Passed - 12/06/2023  1:16 PM      Passed - Valid encounter within last 12 months    Recent Outpatient Visits           3 months ago Type 2 diabetes mellitus with other specified complication, without long-term current use of insulin [facility])   [place] [facility] [place] Marsa PARAS, DO   9 months ago Bronchitis   [facility] [place], Marsa PARAS, [place]

## 2023-12-21 ENCOUNTER — Ambulatory Visit: Payer: Self-pay | Admitting: Family Medicine

## 2023-12-21 ENCOUNTER — Other Ambulatory Visit: Payer: Self-pay | Admitting: Family Medicine

## 2023-12-21 ENCOUNTER — Encounter: Payer: Self-pay | Admitting: Family Medicine

## 2023-12-21 VITALS — BP 132/80 | HR 75 | Ht 69.0 in | Wt 238.1 lb

## 2023-12-21 DIAGNOSIS — K219 Gastro-esophageal reflux disease without esophagitis: Secondary | ICD-10-CM

## 2023-12-21 DIAGNOSIS — E1169 Type 2 diabetes mellitus with other specified complication: Secondary | ICD-10-CM | POA: Diagnosis not present

## 2023-12-21 DIAGNOSIS — Z7984 Long term (current) use of oral hypoglycemic drugs: Secondary | ICD-10-CM

## 2023-12-21 DIAGNOSIS — E559 Vitamin D deficiency, unspecified: Secondary | ICD-10-CM

## 2023-12-21 DIAGNOSIS — E785 Hyperlipidemia, unspecified: Secondary | ICD-10-CM

## 2023-12-21 DIAGNOSIS — Z0000 Encounter for general adult medical examination without abnormal findings: Secondary | ICD-10-CM

## 2023-12-21 LAB — POCT GLYCOSYLATED HEMOGLOBIN (HGB A1C): Hemoglobin A1C: 7.2 % — AB (ref 4.0–5.6)

## 2023-12-21 MED ORDER — MOUNJARO 10 MG/0.5ML ~~LOC~~ SOAJ: 10.0000 mg | SUBCUTANEOUS | 3 refills | Status: AC

## 2023-12-21 NOTE — Patient Instructions (Addendum)
 Thank you for coming to the office today. Dose increase Mounjaro  7.5 up to 10mg , added refills  Great job on weight loss!  Recent Labs    08/31/23 1449 12/21/23 0958  HGBA1C .  9.2* 7.2*    DUE for FASTING BLOOD WORK (no food or drink after midnight before the lab appointment, only water or coffee without cream/sugar on the morning of)  SCHEDULE Lab Only visit in the morning at the clinic for lab draw in 3 MONTHS   - Make sure Lab Only appointment is at about 1 week before your next appointment, so that results will be available  For Lab Results, once available within 2-3 days of blood draw, you can can log in to MyChart online to view your results and a brief explanation. Also, we can discuss results at next follow-up visit.   Please schedule a Follow-up Appointment to: Return in about 3 months (around 03/20/2024) for 3 month fasting lab > 1 week later Annual Physical.  If you have any other questions or concerns, please feel free to call the office or send a message through MyChart. You may also schedule an earlier appointment if necessary.  Additionally, you may be receiving a survey about your experience at our office within a few days to 1 week by e-mail or mail. We value your feedback.  Marsa Officer, DO [facility] [place], [place]

## 2023-12-21 NOTE — Progress Notes (Signed)
 Subjective:    Patient ID: Hannah Cobb, female    DOB: 09/23/1960, 63 y.o.   MRN: [id number]  Hannah Cobb is a 63 y.o. female presenting on 12/21/2023 for Diabetes   HPI  Discussed the use of AI scribe software for clinical note transcription with the patient, who gave verbal consent to proceed.  History of Present Illness   Hannah Cobb Hannah Cobb is a 63 year old female with type 2 diabetes who presents for medication management and follow-up on weight loss and blood sugar control.  Type 2 Diabetes with complication with Hyperlipidemia and Obesity Glycemic control and diabetes management - Type 2 diabetes managed with metformin  and Mounjaro . - For Metformin  XR 500mg  takes two pills in the morning and one in the afternoon. - Currently on Mounjaro  7.5 mg. - A1c improved from 9.5% to 7.2% over the past 90 days. Using CGM monitor Freestyle 3 plus - No symptoms of hypoglycemia. - No recent eye exam on record.  Morbid Obesity BMI >35 with Diabetes Weight loss - Weight decreased from 252 lbs in August to 238 lbs currently. - Interested in increasing Mounjaro  dose for further weight loss.  Gastrointestinal medication use - Taking esomeprazole  (Nexium ) 20 mg every other day due to concerns about long-term use. - Has a surplus of esomeprazole  pills. - Aware of potential vitamin deficiencies associated with long-term esomeprazole  use.  Laboratory monitoring and preventive care - Due for routine blood work, including vitamin D3 and magnesium  levels; last checked in January. - Considering bone density test due to age.  Dermatologic sensitivity - History of sensitive skin affecting use of adhesive patches for glucose monitor. - Alternates arms to avoid skin irritation.       Health Maintenance:  Next visit 03/2024 DEXA + Mammogram orders due.     08/31/2023    2:42 PM 01/13/2023    8:47 AM 12/15/2022    4:18 PM  Depression screen PHQ 2/9  Decreased Interest 0 0 0  Down,  Depressed, Hopeless 0 0 0  PHQ - 2 Score 0 0 0       08/31/2023    2:42 PM 01/13/2023    8:47 AM 12/15/2022    4:18 PM 10/11/2022    8:48 AM  GAD 7 : Generalized Anxiety Score  Nervous, Anxious, on Edge 0 0 0 0  Control/stop worrying 0 0 0 0  Worry too much - different things 0 0 0 0  Trouble relaxing 0 0 0 0  Restless 0 0 0 0  Easily annoyed or irritable 0 0 0 0  Afraid - awful might happen 0 0 0 0  Total GAD 7 Score 0 0 0 0  Anxiety Difficulty    Not difficult at all    Social History[1]  Review of Systems Per HPI unless specifically indicated above     Objective:    BP 132/80 (BP Location: Left Wrist, Patient Position: Sitting, Cuff Size: Normal)   Pulse 75   Ht 5' 9 (1.753 m)   Wt 238 lb 2 oz (108 kg)   SpO2 97%   BMI 35.16 kg/m   Wt Readings from Last 3 Encounters:  12/21/23 238 lb 2 oz (108 kg)  08/31/23 252 lb 8 oz (114.5 kg)  03/01/23 261 lb (118.4 kg)    Physical Exam Vitals and nursing note reviewed.  Constitutional:      General: She is not in acute distress.    Appearance: She is well-developed. She is  not diaphoretic.     Comments: Well-appearing, comfortable, cooperative  HENT:     Head: Normocephalic and atraumatic.  Eyes:     General:        Right eye: No discharge.        Left eye: No discharge.     Conjunctiva/sclera: Conjunctivae normal.  Neck:     Thyroid: No thyromegaly.  Cardiovascular:     Rate and Rhythm: Normal rate and regular rhythm.     Heart sounds: Normal heart sounds. No murmur heard. Pulmonary:     Effort: Pulmonary effort is normal. No respiratory distress.     Breath sounds: Normal breath sounds. No wheezing or rales.  Musculoskeletal:        General: Normal range of motion.     Cervical back: Normal range of motion and neck supple.  Lymphadenopathy:     Cervical: No cervical adenopathy.  Skin:    General: Skin is warm and dry.     Findings: No erythema or rash.  Neurological:     Mental Status: She is alert and  oriented to person, place, and time.  Psychiatric:        Behavior: Behavior normal.     Comments: Well groomed, good eye contact, normal speech and thoughts         Results for orders placed or performed in visit on 12/21/23  POCT HgB A1C   Collection Time: 12/21/23  9:58 AM  Result Value Ref Range   Hemoglobin A1C 7.2 (A) 4.0 - 5.6 %   HbA1c POC (<> result, manual entry)     HbA1c, POC (prediabetic range)     HbA1c, POC (controlled diabetic range)        Assessment & Plan:   Problem List Items Addressed This Visit     Diabetes mellitus (HCC) - Primary   Relevant Medications   MOUNJARO  10 MG/0.5ML Pen   Other Relevant Orders   POCT HgB A1C (Completed)   GERD (gastroesophageal reflux disease)   Morbid obesity (HCC)   Relevant Medications   MOUNJARO  10 MG/0.5ML Pen   Other Visit Diagnoses       Hyperlipidemia associated with type 2 diabetes mellitus (HCC)       Relevant Medications   MOUNJARO  10 MG/0.5ML Pen        Type 2 diabetes mellitus with complication with Hyperlipidemia, Morbid Obesity Type 2 diabetes with improved glycemic control. A1c reduced to 7.2. Weight loss noted on GLP1. No significant hypoglycemia reported. - Increased Mounjaro  from 7.5 up to 10 mg. - Continue metformin  500 mg, 3 times daily. - Blood work scheduled for March. - Follow-up appointment in March.  Gastroesophageal reflux disease Managed with esomeprazole . Discussed long-term PPI effects including vitamin deficiencies and bone health. Overall I advised her that the risks of chronic PPI use do not significantly outweigh the benefits for some patients. - Continue esomeprazole  20 mg every other day.  General Health Maintenance Routine health maintenance discussed. Eligible for bone density test. Eye exam records needed. - Ordered bone density test for March. - Schedule mammogram for March. - Obtain updated eye exam records for 2026.        Orders Placed This Encounter   Procedures   POCT HgB A1C    Meds ordered this encounter  Medications   MOUNJARO  10 MG/0.5ML Pen    Sig: Inject 10 mg into the skin once a week.    Dispense:  2 mL    Refill:  3  Dose increase from 7.5 up to 10mg , fill when patient is ready    Follow up plan: Return in about 3 months (around 03/20/2024) for 3 month fasting lab > 1 week later Annual Physical.  Future labs ordered for 03/26/24    Marsa Officer, DO [facility] 12/21/2023, 10:17 AM     [1]  Social History Tobacco Use   Smoking status: Never   Smokeless tobacco: Never  Vaping Use   Vaping status: Never Used  Substance Use Topics   Alcohol use: Yes    Alcohol/week: 1.0 standard drink of alcohol    Types: 1 Standard drinks or equivalent per week    Comment: rare   Drug use: No

## 2024-01-05 ENCOUNTER — Other Ambulatory Visit: Payer: Self-pay | Admitting: Family Medicine

## 2024-01-05 DIAGNOSIS — E1169 Type 2 diabetes mellitus with other specified complication: Secondary | ICD-10-CM

## 2024-01-06 NOTE — Telephone Encounter (Signed)
 Requested medication (s) are due for refill today -no  Requested medication (s) are on the active medication list -no  Future visit scheduled -yes  Last refill: 10/12/23  Notes to clinic: no longer current dosing, off protocol- provider review   Requested Prescriptions  Pending Prescriptions Disp Refills   MOUNJARO  7.5 MG/0.5ML Pen [Pharmacy Med Name: MOUNJARO  7.5 MG/0.5 ML PEN]  2    Sig: INJECT 7.5 MG SUBCUTANEOUSLY WEEKLY     Off-Protocol Failed - 01/06/2024 11:41 AM      Failed - Medication not assigned to a protocol, review manually.      Passed - Valid encounter within last 12 months    Recent Outpatient Visits           2 weeks ago Type 2 diabetes mellitus with other specified complication, without long-term current use of insulin [facility])   [place] [facility] [place], Marsa PARAS, DO   4 months ago Type 2 diabetes mellitus with other specified complication, without long-term current use of insulin [facility])   [place] [facility] [place] PARAS, DO   10 months ago Bronchitis   [place] [facility] [place] PARAS, DO                 Requested Prescriptions  Pending Prescriptions Disp Refills   MOUNJARO  7.5 MG/0.5ML Pen [Pharmacy Med Name: MOUNJARO  7.5 MG/0.5 ML PEN]  2    Sig: INJECT 7.5 MG SUBCUTANEOUSLY WEEKLY     Off-Protocol Failed - 01/06/2024 11:41 AM      Failed - Medication not assigned to a protocol, review manually.      Passed - Valid encounter within last 12 months    Recent Outpatient Visits           2 weeks ago Type 2 diabetes mellitus with other specified complication, without long-term current use of insulin [facility])   [place] [facility] [place], Marsa PARAS, DO   4 months ago Type 2 diabetes mellitus with other specified complication, without long-term current use of insulin [facility])   [place] [facility] [place]a PARAS, DO   10 months ago Bronchitis   [place] [facility] [place], Marsa PARAS, [place]

## 2024-01-25 ENCOUNTER — Encounter: Payer: Self-pay | Admitting: Dietician

## 2024-01-25 ENCOUNTER — Encounter: Attending: Family Medicine | Admitting: Dietician

## 2024-01-25 VITALS — Ht 69.0 in | Wt 240.3 lb

## 2024-01-25 DIAGNOSIS — E119 Type 2 diabetes mellitus without complications: Secondary | ICD-10-CM | POA: Diagnosis present

## 2024-01-25 NOTE — Patient Instructions (Addendum)
 Set a timer to stop making your bracelets at 90 minutes to get up and move for at least 5 minutes.  Monitor your glucose after your meals. Your goal is for it to stay below 180 about 2 hours after your meals. If it goes higher, look at the carbohydrates you had in that meal and work to lower these choices next time you have them.  Choose lean sources of protein at each meal!

## 2024-01-25 NOTE — Progress Notes (Signed)
 Diabetes Self-Management Education  Visit Type: Follow-up  Appt. Start Time: 1100 Appt. End Time: 1200  01/25/2024  Hannah Cobb, identified by name and date of birth, is a 64 y.o. female with a diagnosis of Diabetes:  .   ASSESSMENT Height 5' 9 (1.753 m), weight 240 lb 4.8 oz (109 kg). Body mass index is 35.49 kg/m.  Dietetic intern, Zakkiyya Yasin, and pt spouse, Belvie, present for appointment Pt reports using Freestyle LibrePLUS, finds it very helpful in seeing how diet and stress effect blood sugar CGM Results from download:   Average glucose:   140 mg/dL for 90 days  Glucose management indicator:   6.7 %  Time in range (70-180 mg/dL):   93 %   (Goal >29%)  Time High (181-250 mg/dL):   7 %   (Goal < 74%)  Time Very High (>250 mg/dL):    0 %   (Goal < 5%)  Time Low (54-69 mg/dL):   0 %   (Goal <5%)  Time Very Low (<54 mg/dL):   0 %   (Goal <8%)  Pt reports Mounjaro  dose has been increased to 10 mg, no changes in appetite or new GI side effects, continuing to take metformin  @500  mg twice in AM, once in the evening. Pt reports continuing success with weight loss.  Pt states they recently went on vacation and ate more sweets but has been following a healthier diet, Eating less peanut butter (having 1 spoonful instead of multiple at a time). Pt also reports a close friend passed unexpectedly recently, states it has been a stressful and they have noticed their glucose has been slightly higher during this period of time. Pt reports limited activity, usually 5,000 - 7,000 steps at most, no structured activity, spending most of their day crafting    Diabetes Self-Management Education - 01/25/24 1423       Visit Information   Visit Type Follow-up      Psychosocial Assessment   Other persons present Patient;Spouse/SO;Other (comment)   Intern   Learning Readiness Contemplating      Pre-Education Assessment   Patient understands the diabetes disease and treatment process.  Comprehends key points    Patient understands incorporating nutritional management into lifestyle. Comprehends key points    Patient undertands incorporating physical activity into lifestyle. Needs Review    Patient understands using medications safely. Comprehends key points    Patient understands monitoring blood glucose, interpreting and using results Needs Review    Patient understands prevention, detection, and treatment of acute complications. Comprehends key points    Patient understands prevention, detection, and treatment of chronic complications. Compreheands key points    Patient understands how to develop strategies to address psychosocial issues. Needs Review    Patient understands how to develop strategies to promote health/change behavior. Needs Review      Complications   Last HgB A1C per patient/outside source 7.2 %   12/21/2023   How often do you check your blood sugar? > 4 times/day    Fasting Blood glucose range (mg/dL) 29-870;869-820    Postprandial Blood glucose range (mg/dL) 869-820;819-799      Dietary Intake   Breakfast 1 sausage patty and 2 eggs w/ 2 slices of baguette, coffee w/ sugar free creamer    Lunch 1/2 Cheeseburger w/ lettuce, tomato, mayonnaise, loaded potato soup    Dinner 3 pretzels bites w/ beer cheese, caesar salad, filet mignon, 5 mini potatoes, 3 asparagus sticks, 1 scoop of ice cream (graham cracker, chocolate,  caramel)    Beverage(s) Water      Activity / Exercise   Activity / Exercise Type ADL's      Patient Education   Disease Pathophysiology Explored patient's options for treatment of their diabetes    Healthy Eating Role of diet in the treatment of diabetes and the relationship between the three main macronutrients and blood glucose level;Reviewed blood glucose goals for pre and post meals and how to evaluate the patients' food intake on their blood glucose level.;Information on hints to eating out and maintain blood glucose control.;Meal  options for control of blood glucose level and chronic complications.    Being Active Role of exercise on diabetes management, blood pressure control and cardiac health.    Medications Reviewed patients medication for diabetes, action, purpose, timing of dose and side effects.    Monitoring Taught/evaluated CGM (comment);Identified appropriate SMBG and/or A1C goals.   TIR, Daily patterns   Chronic complications Relationship between chronic complications and blood glucose control    Diabetes Stress and Support Role of stress on diabetes      Individualized Goals (developed by patient)   Nutrition Follow meal plan discussed    Physical Activity 15 minutes per day   Get up and move while crafting   Medications take my medication as prescribed    Monitoring  Consistenly use CGM;Test my blood glucose as discussed    Problem Solving Eating Pattern;Social situations    Reducing Risk examine blood glucose patterns      Patient Self-Evaluation of Goals - Patient rates self as meeting previously set goals (% of time)   Nutrition 50 - 75 % (half of the time)    Physical Activity < 25% (hardly ever/never)    Medications >75% (most of the time)    Monitoring >75% (most of the time)   CGM   Problem Solving and behavior change strategies  25 - 50% (sometimes)    Reducing Risk (treating acute and chronic complications) 50 - 75 % (half of the time)    Health Coping 25 - 50% (sometimes)      Post-Education Assessment   Patient understands the diabetes disease and treatment process. Comprehends key points    Patient understands incorporating nutritional management into lifestyle. Comprehends key points    Patient undertands incorporating physical activity into lifestyle. Needs Review    Patient understands using medications safely. Comphrehends key points    Patient understands monitoring blood glucose, interpreting and using results Comprehends key points    Patient understands prevention, detection, and  treatment of acute complications. Comprehends key points    Patient understands prevention, detection, and treatment of chronic complications. Comprehends key points    Patient understands how to develop strategies to address psychosocial issues. Comprehends key points    Patient understands how to develop strategies to promote health/change behavior. Demonstrates understanding / competency      Outcomes   Expected Outcomes Demonstrated interest in learning. Expect positive outcomes    Future DMSE 3-4 months    Program Status Not Completed      Subsequent Visit   Since your last visit have you continued or begun to take your medications as prescribed? Yes    Since your last visit have you had your blood pressure checked? Yes    Is your most recent blood pressure lower, unchanged, or higher since your last visit? Lower    Since your last visit have you experienced any weight changes? Loss    Since your last  visit, are you checking your blood glucose at least once a day? Yes   CGM         Individualized Plan for Diabetes Self-Management Training:   Learning Objective:  Patient will have a greater understanding of diabetes self-management. Patient education plan is to attend individual and/or group sessions per assessed needs and concerns.   Plan:   Patient Instructions  Set a timer to stop making your bracelets at 90 minutes to get up and move for at least 5 minutes.  Monitor your glucose after your meals. Your goal is for it to stay below 180 about 2 hours after your meals. If it goes higher, look at the carbohydrates you had in that meal and work to lower these choices next time you have them.  Choose lean sources of protein at each meal!   Expected Outcomes:  Demonstrated interest in learning. Expect positive outcomes  Education material provided: Protein Foods List  If problems or questions, patient to contact team via:  Phone and Email  Future DSME appointment: 3-4  months

## 2024-02-01 LAB — OPHTHALMOLOGY REPORT-SCANNED

## 2024-02-03 ENCOUNTER — Other Ambulatory Visit: Payer: Self-pay | Admitting: Family Medicine

## 2024-02-03 DIAGNOSIS — K219 Gastro-esophageal reflux disease without esophagitis: Secondary | ICD-10-CM

## 2024-02-08 ENCOUNTER — Other Ambulatory Visit: Admitting: Pharmacist

## 2024-02-08 DIAGNOSIS — Z7985 Long-term (current) use of injectable non-insulin antidiabetic drugs: Secondary | ICD-10-CM

## 2024-02-08 DIAGNOSIS — E1169 Type 2 diabetes mellitus with other specified complication: Secondary | ICD-10-CM

## 2024-02-08 NOTE — Patient Instructions (Signed)
 Goals Addressed             This Visit's Progress    Pharmacy Goals       The goal A1c is less than 7%. This is the best way to reduce the risk of the long term complications of diabetes, including heart disease, kidney disease, eye disease, strokes, and nerve damage. An A1c of less than 7% corresponds with fasting sugars less than 130 and 2 hour after meal sugars less than 180.  You can reach MyFreeStyle Support at [phone]. Their customer support is available 7 days a week 8 am to 8 pm.   Thank you!   Sharyle Sia, PharmD, JAQUELINE, CPP Clinical Pharmacist [facility] [phone]

## 2024-02-08 NOTE — Progress Notes (Signed)
 "  02/08/2024 Name: Hannah Cobb MRN: [id number] DOB: 1960/03/16  Chief Complaint  Patient presents with   Medication Management    Hannah Cobb is a 64 y.o. year old female who presented for a telephone visit.   They were referred to the pharmacist by their PCP for assistance in managing diabetes and medication access.      Subjective:   Care Team: Primary Care Provider: Edman Marsa PARAS, DO ; Next Scheduled Visit: 04/02/2024 Dermatologist: Dr Dela at [facility] Dietitian: Katheleen Aleene ORN, RD; Initial Scheduled Visit: 04/24/2024   Medication Access/Adherence  Current Pharmacy:  [id number] - [place], [place] - [address] [place] [address] Phone: [phone] Fax: [phone]   Patient reports affordability concerns with their medications: No  Patient reports access/transportation concerns to their pharmacy: No  Patient reports adherence concerns with their medications:  No     Uses weekly pillbox to organize her medications   Diabetes:   Current medications:  - Metformin  ER 500 mg -2 in morning and 1 in PM - Mounjaro  10 mg once weekly on Sundays (Reports increased to current dose in January) Reports tolerating well and has noticed improvement in appetite since switched to Mounjaro    Medications tried in the past: Mounjaro  (cost)   Patient using Freestyle Libre 3 Plus continuous glucose monitor. Using App on phone in place of reader device   Date of Download: 02/08/24 % Time CGM is active: 94% Average Glucose: 149 mg/dL Glucose Management Indicator: 6.9%  Glucose Variability: 16.3% (goal <36%) Time in Goal:  - Time in range 70-180: 89% - Time above range: 11% - Time below range: 0%       Reports using CGM as feedback on dietary choices/to guide dietary changes and also seen by Dietitian Aleene Katheleen   Patient denies hypoglycemic s/sx including dizziness, shakiness, sweating.    Statin therapy: none   Current  physical activity: reports currently limited to walking around her home. Planning to start indoor walking video and restart water aerobics when it gets warmer   Objective:  Lab Results  Component Value Date   HGBA1C 7.2 (A) 12/21/2023    Lab Results  Component Value Date   CREATININE 0.76 01/07/2023   BUN 15 01/07/2023   NA 134 (L) 01/07/2023   K 4.6 01/07/2023   CL 97 (L) 01/07/2023   CO2 27 01/07/2023    Lab Results  Component Value Date   CHOL 171 01/07/2023   HDL 40 (L) 01/07/2023   LDLCALC 99 01/07/2023   TRIG 205 (H) 01/07/2023   CHOLHDL 4.3 01/07/2023   BP Readings from Last 3 Encounters:  12/21/23 132/80  08/31/23 130/80  03/01/23 120/82   Pulse Readings from Last 3 Encounters:  12/21/23 75  08/31/23 70  03/01/23 80     Medications Reviewed Today     Reviewed by Alana Sharyle LABOR, RPH-CPP (Pharmacist) on 02/08/24 at [phone]  Med List Status: <None>   Medication Order Taking? Sig Documenting Provider Last Dose Status Informant  Cholecalciferol 125 MCG (5000 UT) TABS [id number]  Take 1 tablet by mouth daily. [provider]  Active   clobetasol (TEMOVATE) 0.05 % external solution [id number]  Apply topically. [provider]  Active   clorazepate  (TRANXENE ) 7.5 MG tablet [id number]  Take 1-2 tablets (7.5-15 mg total) by mouth 2 (two) times daily as needed for anxiety. Karamalegos, Marsa PARAS, DO  Active   Continuous Glucose Sensor (FREESTYLE LIBRE 3 PLUS  SENSOR) MISC [id number]  Place 1 sensor on the skin every 15 days. Use to check glucose continuously Edman Marsa PARAS, DO  Active   docusate sodium (COLACE) 100 MG capsule [id number]  Take 100 mg by mouth daily.  Patient taking differently: Take 200 mg by mouth daily.   [provider]  Active   esomeprazole  (NEXIUM ) 20 MG capsule [id number]  TAKE 1 CAPSULE BY MOUTH EVERY DAY BEFORE BREAKFAST Karamalegos, Alexander PARAS, DO  Active   fluticasone  (FLONASE ) 50 MCG/ACT nasal spray  [id number]  PLACE 2 SPRAYS INTO BOTH NOSTRILS DAILY. USE FOR 4-6 WEEKS THEN STOP AND USE SEASONALLY OR AS NEEDED. Edman Marsa PARAS, DO  Active   gabapentin  (NEURONTIN ) 300 MG capsule [id number]  Take 1 capsule (300 mg total) by mouth daily as needed (restless leg). Edman Marsa PARAS, DO  Active   Ixekizumab (TALTZ Bath) [id number]  Inject into the skin. [provider]  Active   metFORMIN  (GLUCOPHAGE -XR) 500 MG 24 hr tablet [id number] Yes TAKE 2 TABLETS IN MORNING WITH MEAL AND TAKE 1 TABLET IN EVENING WITH MEAL. Edman Marsa PARAS, DO  Active   MOUNJARO  10 MG/0.5ML Pen [id number] Yes Inject 10 mg into the skin once a week. Edman Marsa PARAS, DO  Active               Assessment/Plan:   Diabetes: - Reviewed long term cardiovascular and renal outcomes of uncontrolled blood sugar - Have reviewed goal A1c, goal fasting, and goal 2 hour post prandial glucose - Reviewed dietary modifications including importance of having regular well-balanced meals and snacks throughout the day, while controlling carbohydrate portion sizes Extensive dietary counseling including education on focus on lean proteins, fruits and vegetables, whole grains and increased fiber consumption, adequate hydration - Recommend to continue to use Freestyle Libre 3 CGM to monitor blood sugar/as feedback on dietary choices Recommend to check glucose with fingerstick check when needed for symptoms and as back up to CGM.  Patient to contact office if needed for readings outside of established parameters or symptoms - Discuss importance of LDL control/ASCVD risk reduction. Patient agreeable to consider addition of cholesterol medication if needed depending on next lipid panel results Counsel on dietary factors to aid with LDL control including strategies to limit intake of saturated and trans fats - Encourage patient in her plan to increase walking     Follow Up Plan: Clinical Pharmacist will follow  up with patient by telephone on 05/21/2024 at 9:30 AM      Sharyle Sia, PharmD, [facility] [phone]   "

## 2024-02-16 ENCOUNTER — Ambulatory Visit: Admitting: Obstetrics and Gynecology

## 2024-03-26 ENCOUNTER — Other Ambulatory Visit

## 2024-04-02 ENCOUNTER — Encounter: Admitting: Family Medicine

## 2024-04-24 ENCOUNTER — Encounter: Admitting: Dietician

## 2024-05-21 ENCOUNTER — Other Ambulatory Visit
